# Patient Record
Sex: Female | Born: 1980
Health system: Southern US, Community
[De-identification: ages and names within clinical notes are randomized; demographics above are authoritative.]

## PROBLEM LIST (undated history)

## (undated) DIAGNOSIS — S83104A Unspecified dislocation of right knee, initial encounter: Secondary | ICD-10-CM

## (undated) DIAGNOSIS — K219 Gastro-esophageal reflux disease without esophagitis: Secondary | ICD-10-CM

## (undated) DIAGNOSIS — G5602 Carpal tunnel syndrome, left upper limb: Secondary | ICD-10-CM

## (undated) DIAGNOSIS — G5601 Carpal tunnel syndrome, right upper limb: Secondary | ICD-10-CM

## (undated) DIAGNOSIS — D649 Anemia, unspecified: Secondary | ICD-10-CM

## (undated) HISTORY — PX: TONSILLECTOMY: SUR1361

## (undated) HISTORY — PX: CHOLECYSTECTOMY: SHX55

---
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
## 2000-06-08 ENCOUNTER — Other Ambulatory Visit: Admission: RE | Admit: 2000-06-08 | Discharge: 2000-06-08 | Payer: Self-pay | Admitting: Obstetrics

## 2000-06-20 ENCOUNTER — Emergency Department (HOSPITAL_COMMUNITY): Admission: EM | Admit: 2000-06-20 | Discharge: 2000-06-20 | Payer: Self-pay | Admitting: Emergency Medicine

## 2000-12-18 ENCOUNTER — Inpatient Hospital Stay (HOSPITAL_COMMUNITY): Admission: AD | Admit: 2000-12-18 | Discharge: 2000-12-21 | Payer: Self-pay | Admitting: Obstetrics

## 2001-10-17 ENCOUNTER — Emergency Department (HOSPITAL_COMMUNITY): Admission: EM | Admit: 2001-10-17 | Discharge: 2001-10-18 | Payer: Self-pay | Admitting: Emergency Medicine

## 2002-03-13 ENCOUNTER — Encounter: Payer: Self-pay | Admitting: Emergency Medicine

## 2002-03-13 ENCOUNTER — Emergency Department (HOSPITAL_COMMUNITY): Admission: EM | Admit: 2002-03-13 | Discharge: 2002-03-13 | Payer: Self-pay | Admitting: Emergency Medicine

## 2002-08-05 ENCOUNTER — Emergency Department (HOSPITAL_COMMUNITY): Admission: EM | Admit: 2002-08-05 | Discharge: 2002-08-05 | Payer: Self-pay | Admitting: Emergency Medicine

## 2003-03-20 ENCOUNTER — Inpatient Hospital Stay (HOSPITAL_COMMUNITY): Admission: AD | Admit: 2003-03-20 | Discharge: 2003-03-20 | Payer: Self-pay | Admitting: Obstetrics & Gynecology

## 2003-03-21 ENCOUNTER — Inpatient Hospital Stay (HOSPITAL_COMMUNITY): Admission: AD | Admit: 2003-03-21 | Discharge: 2003-03-21 | Payer: Self-pay | Admitting: Family Medicine

## 2004-01-18 ENCOUNTER — Emergency Department (HOSPITAL_COMMUNITY): Admission: EM | Admit: 2004-01-18 | Discharge: 2004-01-18 | Payer: Self-pay | Admitting: Emergency Medicine

## 2005-12-11 ENCOUNTER — Emergency Department (HOSPITAL_COMMUNITY): Admission: EM | Admit: 2005-12-11 | Discharge: 2005-12-11 | Payer: Self-pay | Admitting: Emergency Medicine

## 2009-03-11 ENCOUNTER — Emergency Department (HOSPITAL_COMMUNITY): Admission: EM | Admit: 2009-03-11 | Discharge: 2009-03-11 | Payer: Self-pay | Admitting: Emergency Medicine

## 2009-03-14 ENCOUNTER — Emergency Department (HOSPITAL_COMMUNITY): Admission: EM | Admit: 2009-03-14 | Discharge: 2009-03-14 | Payer: Self-pay | Admitting: Emergency Medicine

## 2009-04-02 ENCOUNTER — Encounter: Admission: RE | Admit: 2009-04-02 | Discharge: 2009-04-02 | Payer: Self-pay | Admitting: Internal Medicine

## 2010-07-21 LAB — CBC
HCT: 36.3 % (ref 36.0–46.0)
Hemoglobin: 12 g/dL (ref 12.0–15.0)
MCHC: 33.1 g/dL (ref 30.0–36.0)
MCV: 84.5 fL (ref 78.0–100.0)
Platelets: 197 10*3/uL (ref 150–400)
RBC: 4.3 MIL/uL (ref 3.87–5.11)
RDW: 13.1 % (ref 11.5–15.5)
WBC: 6.1 10*3/uL (ref 4.0–10.5)

## 2010-07-21 LAB — COMPREHENSIVE METABOLIC PANEL
ALT: 16 U/L (ref 0–35)
AST: 16 U/L (ref 0–37)
Albumin: 3.7 g/dL (ref 3.5–5.2)
Alkaline Phosphatase: 40 U/L (ref 39–117)
BUN: 10 mg/dL (ref 6–23)
CO2: 24 mEq/L (ref 19–32)
Calcium: 9 mg/dL (ref 8.4–10.5)
Chloride: 105 mEq/L (ref 96–112)
Creatinine, Ser: 0.72 mg/dL (ref 0.4–1.2)
GFR calc Af Amer: >60 mL/min (ref >60)
GFR calc non Af Amer: >60 mL/min (ref >60)
Glucose, Bld: 133 mg/dL — ABNORMAL HIGH (ref 70–99)
Potassium: 3.9 mEq/L (ref 3.5–5.1)
Sodium: 135 mEq/L (ref 135–145)
Total Bilirubin: 0.5 mg/dL (ref 0.3–1.2)
Total Protein: 7.2 g/dL (ref 6.0–8.3)

## 2010-07-21 LAB — POCT CARDIAC MARKERS
Myoglobin, poc: 59 ng/mL (ref 12–200)
Troponin i, poc: 0.13 ng/mL — ABNORMAL HIGH (ref 0.00–0.09)
Troponin i, poc: <0.05 ng/mL (ref 0.00–0.09)

## 2010-07-21 LAB — DIFFERENTIAL
Eosinophils Relative: 2 % (ref 0–5)
Lymphs Abs: 1.6 10*3/uL (ref 0.7–4.0)
Monocytes Absolute: 0.3 10*3/uL (ref 0.1–1.0)
Monocytes Relative: 5 % (ref 3–12)
Neutro Abs: 4 10*3/uL (ref 1.7–7.7)

## 2010-07-21 LAB — CK TOTAL AND CKMB (NOT AT ARMC): Total CK: 249 U/L — ABNORMAL HIGH (ref 7–177)

## 2012-04-23 ENCOUNTER — Encounter (HOSPITAL_COMMUNITY): Payer: Self-pay | Admitting: *Deleted

## 2012-04-23 ENCOUNTER — Emergency Department (HOSPITAL_COMMUNITY)
Admission: EM | Admit: 2012-04-23 | Discharge: 2012-04-23 | Disposition: A | Discharge status: Home / Self Care | Payer: Medicaid Other | Source: Home / Self Care | Attending: Emergency Medicine | Admitting: Emergency Medicine

## 2012-04-23 DIAGNOSIS — J329 Chronic sinusitis, unspecified: Secondary | ICD-10-CM

## 2012-04-23 MED ORDER — AZITHROMYCIN 250 MG PO TABS: ORAL_TABLET | ORAL | Status: DC

## 2012-04-23 MED ORDER — CETIRIZINE-PSEUDOEPHEDRINE ER 5-120 MG PO TB12: 1.0000 | ORAL_TABLET | Freq: Two times a day (BID) | ORAL | Status: DC

## 2012-04-23 MED ORDER — SALINE NASAL SPRAY 0.65 % NA SOLN: 1.0000 | NASAL | Status: DC | PRN

## 2012-04-23 NOTE — ED Notes (Signed)
C/o sinuses are stuffy, sneezing, R ear stopped up.  No sorethroat or fever or cough.  Nasal drainage was clear and now its yellow.  C/o eyes watering and pressure around her eyes.

## 2012-04-23 NOTE — ED Provider Notes (Signed)
History     CSN: [id number]  Arrival date & time 04/23/12  1191   First MD Initiated Contact with Patient 04/23/12 2031      Chief Complaint  Patient presents with  . Sinusitis    (Consider location/radiation/quality/duration/timing/severity/associated sxs/prior treatment) Patient is a 32 y.o. female presenting with sinusitis. The history is provided by the patient.  Sinusitis  This is a new problem. The current episode started more than 1 week ago. The problem has been gradually worsening. There has been no fever. Associated symptoms include congestion, ear pain, sinus pressure and cough. She has tried nothing for the symptoms.    Past Medical History  Diagnosis Date  . Bronchitis   . Carpal tunnel syndrome on both sides   . Shoulder pain     Past Surgical History  Procedure Date  . Cholecystectomy   . Tonsillectomy     History reviewed. No pertinent family history.  History  Substance Use Topics  . Smoking status: Current Every Day Smoker    Types: Cigars  . Smokeless tobacco: Not on file  . Alcohol Use: Yes     Comment: occasional    OB History    Grav Para Term Preterm Abortions TAB SAB Ect Mult Living                  Review of Systems  HENT: Positive for ear pain, congestion, rhinorrhea and sinus pressure.   Respiratory: Positive for cough.   All other systems reviewed and are negative.    Allergies  Review of patient's allergies indicates no known allergies.  Home Medications   Current Outpatient Rx  Name  Route  Sig  Dispense  Refill  . ALBUTEROL SULFATE HFA 108 (90 BASE) MCG/ACT IN AERS   Inhalation   Inhale 2 puffs into the lungs every 6 (six) hours as needed.         . IBUPROFEN 800 MG PO TABS   Oral   Take 800 mg by mouth every 8 (eight) hours as needed.         Azzie Roup ACE-ETH ESTRAD-FE 1-20 MG-MCG(24) PO CHEW   Oral   Chew by mouth.         . OMEPRAZOLE 20 MG PO CPDR   Oral   Take 20 mg by mouth daily.         .  AZITHROMYCIN 250 MG PO TABS      Azithromycin 500mg  on day 1, then 250mg  on days 2-4   6 tablet   0   . CETIRIZINE-PSEUDOEPHEDRINE ER 5-120 MG PO TB12   Oral   Take 1 tablet by mouth 2 (two) times daily.   30 tablet   1   . SALINE NASAL SPRAY 0.65 % NA SOLN   Nasal   Place 1 spray into the nose as needed for congestion.   30 mL   12     BP 139/88  Pulse 86  Temp 99.1 F (37.3 C) (Oral)  Resp 18  SpO2 100%  LMP 04/05/2012  Physical Exam  Nursing note and vitals reviewed. Constitutional: She is oriented to person, place, and time. Vital signs are normal. She appears well-developed and well-nourished. She is active and cooperative.  HENT:  Head: Normocephalic.  Right Ear: Tympanic membrane and external ear normal.  Left Ear: Tympanic membrane and external ear normal.  Nose: Right sinus exhibits frontal sinus tenderness. Right sinus exhibits no maxillary sinus tenderness. Left sinus exhibits frontal sinus tenderness. Left  sinus exhibits no maxillary sinus tenderness.  Mouth/Throat: Uvula is midline, oropharynx is clear and moist and mucous membranes are normal. No oropharyngeal exudate.  Eyes: Conjunctivae normal and EOM are normal. Pupils are equal, round, and reactive to light. No scleral icterus.  Neck: Trachea normal and normal range of motion. Neck supple.  Cardiovascular: Normal rate, regular rhythm and normal heart sounds.   Pulmonary/Chest: Effort normal and breath sounds normal.  Lymphadenopathy:    She has no cervical adenopathy.  Neurological: She is alert and oriented to person, place, and time. No cranial nerve deficit or sensory deficit.  Skin: Skin is warm and dry.  Psychiatric: She has a normal mood and affect. Her speech is normal and behavior is normal. Judgment and thought content normal. Cognition and memory are normal.    ED Course  Procedures (including critical care time)  Labs Reviewed - No data to display No results found.   1. Sinusitis        MDM  Medications as prescribed, follow up with pcp if symptoms are not improved.       Johnsie Kindred, NP 04/23/12 2038

## 2012-04-23 NOTE — ED Provider Notes (Signed)
Medical screening examination/treatment/procedure(s) were performed by non-physician practitioner and as supervising physician I was immediately available for consultation/collaboration.  Leslee Home, M.D.   Reuben Likes, MD 04/23/12 [phone]

## 2012-10-11 ENCOUNTER — Ambulatory Visit (HOSPITAL_COMMUNITY)
Admission: RE | Admit: 2012-10-11 | Discharge: 2012-10-11 | Disposition: A | Discharge status: Home / Self Care | Payer: Medicaid Other | Source: Ambulatory Visit | Attending: Oral Surgery | Admitting: Oral Surgery

## 2012-10-11 ENCOUNTER — Other Ambulatory Visit: Payer: Self-pay | Admitting: Oral Surgery

## 2012-10-11 DIAGNOSIS — Z1389 Encounter for screening for other disorder: Secondary | ICD-10-CM | POA: Insufficient documentation

## 2012-10-11 DIAGNOSIS — Z9089 Acquired absence of other organs: Secondary | ICD-10-CM | POA: Insufficient documentation

## 2012-10-11 DIAGNOSIS — T819XXA Unspecified complication of procedure, initial encounter: Secondary | ICD-10-CM

## 2012-10-11 DIAGNOSIS — K011 Impacted teeth: Secondary | ICD-10-CM

## 2012-10-11 HISTORY — PX: WISDOM TOOTH EXTRACTION: SHX21

## 2012-10-18 ENCOUNTER — Emergency Department (HOSPITAL_COMMUNITY)
Admission: EM | Admit: 2012-10-18 | Discharge: 2012-10-19 | Disposition: A | Discharge status: Home / Self Care | Payer: Medicaid Other | Attending: Emergency Medicine | Admitting: Emergency Medicine

## 2012-10-18 ENCOUNTER — Encounter (HOSPITAL_COMMUNITY): Payer: Self-pay | Admitting: *Deleted

## 2012-10-18 DIAGNOSIS — K089 Disorder of teeth and supporting structures, unspecified: Secondary | ICD-10-CM | POA: Insufficient documentation

## 2012-10-18 DIAGNOSIS — H9209 Otalgia, unspecified ear: Secondary | ICD-10-CM | POA: Insufficient documentation

## 2012-10-18 DIAGNOSIS — F17200 Nicotine dependence, unspecified, uncomplicated: Secondary | ICD-10-CM | POA: Insufficient documentation

## 2012-10-18 DIAGNOSIS — Z8669 Personal history of other diseases of the nervous system and sense organs: Secondary | ICD-10-CM | POA: Insufficient documentation

## 2012-10-18 DIAGNOSIS — K0889 Other specified disorders of teeth and supporting structures: Secondary | ICD-10-CM

## 2012-10-18 DIAGNOSIS — Z79899 Other long term (current) drug therapy: Secondary | ICD-10-CM | POA: Insufficient documentation

## 2012-10-18 DIAGNOSIS — Z8709 Personal history of other diseases of the respiratory system: Secondary | ICD-10-CM | POA: Insufficient documentation

## 2012-10-18 NOTE — ED Notes (Signed)
Pt had oral surgery for wisdom teeth removal a week ago today,  On left side,  Pt has swelling on left side, she is taking antibiotics,  Pt states surgery went bad from beginning,  She was sent immediately after wisdom teeth removal because they thought she had swallowed her tooth,  Pt is in pain 10/10  And feels like needle sticking in left ear.  Pt is alert and oriented but tearful.

## 2012-10-19 MED ORDER — TRAMADOL HCL 50 MG PO TABS: 50.0000 mg | ORAL_TABLET | Freq: Four times a day (QID) | ORAL | Status: DC | PRN

## 2012-10-19 NOTE — ED Provider Notes (Signed)
History    CSN: [id number] Arrival date & time 10/18/12  2108  First MD Initiated Contact with Patient 10/19/12 0025     Chief Complaint  Patient presents with  . Facial Swelling   (Consider location/radiation/quality/duration/timing/severity/associated sxs/prior Treatment) HPI Comments: Patient is a 32 year old female who presents for a stabbing pain in her left ear x5 days. Patient states that she had her left upper and lower wisdom teeth removed by Dr. Barbette Merino one week ago. Patient admits to developing infection post wisdom tooth extraction. Dr. Barbette Merino put patient on Augmentin 5 days ago for symptoms. Patient states that infection has resolved, but stabbing pain has persisted. Patient states the pain is constant, alleviated with goody powders and eased with heat packs. Patient states the pain is 10 out of 10 in severity. She denies fevers, eye pain or redness, ear discharge, drooling, difficulty speaking or swallowing, and shortness of breath.  The history is provided by the patient. No language interpreter was used.   Past Medical History  Diagnosis Date  . Bronchitis   . Carpal tunnel syndrome on both sides   . Shoulder pain    Past Surgical History  Procedure Laterality Date  . Cholecystectomy    . Tonsillectomy    . Wisdom tooth extraction      removal on June 26th 2014   History reviewed. No pertinent family history. History  Substance Use Topics  . Smoking status: Current Every Day Smoker    Types: Cigars  . Smokeless tobacco: Not on file  . Alcohol Use: Yes     Comment: occasional   OB History   Grav Para Term Preterm Abortions TAB SAB Ect Mult Living                 Review of Systems  Constitutional: Negative for fever.  HENT: Positive for ear pain, facial swelling (mild; L side) and dental problem (pain). Negative for hearing loss, sore throat, drooling, trouble swallowing, neck pain, neck stiffness and ear discharge.   Respiratory: Negative for shortness of  breath.   Neurological: Negative for weakness and numbness.  All other systems reviewed and are negative.    Allergies  Review of patient's allergies indicates no known allergies.  Home Medications   Current Outpatient Rx  Name  Route  Sig  Dispense  Refill  . amoxicillin-clavulanate (AUGMENTIN) 875-125 MG per tablet   Oral   Take 1 tablet by mouth 2 (two) times daily. For 7 days only. Star date 10/15/12         . ibuprofen (ADVIL,MOTRIN) 800 MG tablet   Oral   Take 800 mg by mouth every 8 (eight) hours as needed for pain.          . Norethin Ace-Eth Estrad-FE (LOESTRIN FE 1/20 PO)   Oral   Take 1 tablet by mouth daily.         Marland Kitchen omeprazole (PRILOSEC) 20 MG capsule   Oral   Take 20 mg by mouth daily.         Marland Kitchen oxyCODONE-acetaminophen (PERCOCET/ROXICET) 5-325 MG per tablet   Oral   Take 1 tablet by mouth every 4 (four) hours as needed for pain.         Marland Kitchen albuterol (PROVENTIL HFA;VENTOLIN HFA) 108 (90 BASE) MCG/ACT inhaler   Inhalation   Inhale 2 puffs into the lungs every 6 (six) hours as needed for shortness of breath.          . traMADol (ULTRAM) 50 MG  tablet   Oral   Take 1 tablet (50 mg total) by mouth every 6 (six) hours as needed for pain.   25 tablet   0    BP 133/79  Pulse 85  Temp(Src) 99.8 F (37.7 C) (Oral)  Resp 20  SpO2 100%  LMP 09/20/2012  Physical Exam  Nursing note and vitals reviewed. Constitutional: She is oriented to person, place, and time. She appears well-developed and well-nourished. No distress.  HENT:  Head: Normocephalic and atraumatic. No trismus in the jaw.  Right Ear: Tympanic membrane, external ear and ear canal normal. No tenderness. No mastoid tenderness.  Left Ear: Tympanic membrane, external ear and ear canal normal. No tenderness. No mastoid tenderness.  Nose: Nose normal.  Mouth/Throat: Uvula is midline, oropharynx is clear and moist and mucous membranes are normal. No oral lesions. Abnormal dentition (various  teeth missing). No dental abscesses. No oropharyngeal exudate, posterior oropharyngeal edema, posterior oropharyngeal erythema or tonsillar abscesses.  Eyes: Conjunctivae and EOM are normal. Pupils are equal, round, and reactive to light. No scleral icterus.  Neck: Normal range of motion. Neck supple.  Cardiovascular: Normal rate, regular rhythm and normal heart sounds.   Pulmonary/Chest: Effort normal and breath sounds normal. No stridor. No respiratory distress. She has no wheezes. She has no rales.  Musculoskeletal: Normal range of motion.  Lymphadenopathy:    She has no cervical adenopathy.  Neurological: She is alert and oriented to person, place, and time.  Skin: Skin is warm and dry. No rash noted. She is not diaphoretic. No erythema. No pallor.  Psychiatric: She has a normal mood and affect. Her behavior is normal.    ED Course  Procedures (including critical care time) Labs Reviewed - No data to display No results found.  1. Pain, dental     MDM  Referred dental pain without complicating factors - pain secondary to wisdom tooth extraction one week ago. On exam there is no trismus or stridor. Uvula midline without evidence of peritonsillar abscess. No area of fluctuance to suspect drainable dental abscess. Gingiva pink and healthy appearing without erythema or swelling. No signs concerning for Ludwig's angina. Patient reliable for oral surgery and dentistry followup for further evaluation of symptoms. Patient does not want Percocet as she says it makes her feel too "drugged up". Rx for tramadol provided for pain and advised patient to continue heat packs. Indications for ED return discussed with the patient who verbalizes comfort and understanding with this discharge plan.  Antony Madura, PA-C 10/19/12 0110

## 2012-10-19 NOTE — ED Provider Notes (Signed)
Medical screening examination/treatment/procedure(s) were performed by non-physician practitioner and as supervising physician I was immediately available for consultation/collaboration.   Joya Gaskins, MD 10/19/12 [phone]

## 2014-12-15 LAB — PROCEDURE REPORT - SCANNED: Pap: NEGATIVE

## 2016-01-13 ENCOUNTER — Encounter: Payer: Self-pay | Admitting: *Deleted

## 2016-07-26 ENCOUNTER — Other Ambulatory Visit: Payer: Self-pay | Admitting: Orthopedic Surgery

## 2016-08-04 ENCOUNTER — Encounter (HOSPITAL_BASED_OUTPATIENT_CLINIC_OR_DEPARTMENT_OTHER): Payer: Self-pay | Admitting: *Deleted

## 2016-08-11 ENCOUNTER — Encounter (HOSPITAL_BASED_OUTPATIENT_CLINIC_OR_DEPARTMENT_OTHER)
Admission: RE | Disposition: A | Discharge status: Home / Self Care | Payer: Self-pay | Source: Ambulatory Visit | Attending: Orthopedic Surgery

## 2016-08-11 ENCOUNTER — Ambulatory Visit (HOSPITAL_BASED_OUTPATIENT_CLINIC_OR_DEPARTMENT_OTHER)
Admission: RE | Admit: 2016-08-11 | Discharge: 2016-08-11 | Disposition: A | Discharge status: Home / Self Care | Payer: 59 | Source: Ambulatory Visit | Attending: Orthopedic Surgery | Admitting: Orthopedic Surgery

## 2016-08-11 ENCOUNTER — Ambulatory Visit (HOSPITAL_BASED_OUTPATIENT_CLINIC_OR_DEPARTMENT_OTHER): Payer: 59 | Admitting: Anesthesiology

## 2016-08-11 ENCOUNTER — Encounter (HOSPITAL_BASED_OUTPATIENT_CLINIC_OR_DEPARTMENT_OTHER): Payer: Self-pay | Admitting: Anesthesiology

## 2016-08-11 DIAGNOSIS — F17290 Nicotine dependence, other tobacco product, uncomplicated: Secondary | ICD-10-CM | POA: Insufficient documentation

## 2016-08-11 DIAGNOSIS — Z9049 Acquired absence of other specified parts of digestive tract: Secondary | ICD-10-CM | POA: Insufficient documentation

## 2016-08-11 DIAGNOSIS — Z79899 Other long term (current) drug therapy: Secondary | ICD-10-CM | POA: Diagnosis not present

## 2016-08-11 DIAGNOSIS — G5601 Carpal tunnel syndrome, right upper limb: Secondary | ICD-10-CM | POA: Diagnosis not present

## 2016-08-11 DIAGNOSIS — Z6835 Body mass index (BMI) 35.0-35.9, adult: Secondary | ICD-10-CM | POA: Diagnosis not present

## 2016-08-11 HISTORY — PX: CARPAL TUNNEL RELEASE: SHX101

## 2016-08-11 HISTORY — DX: Carpal tunnel syndrome, right upper limb: G56.01

## 2016-08-11 SURGERY — CARPAL TUNNEL RELEASE
Anesthesia: Monitor Anesthesia Care | Site: Hand | Laterality: Right

## 2016-08-11 MED ORDER — FENTANYL CITRATE (PF) 100 MCG/2ML IJ SOLN
INTRAMUSCULAR | Status: DC | PRN
  Administered 2016-08-11 (×2): 50 ug via INTRAVENOUS

## 2016-08-11 MED ORDER — HYDROMORPHONE HCL 1 MG/ML IJ SOLN: 0.2500 mg | INTRAMUSCULAR | Status: DC | PRN

## 2016-08-11 MED ORDER — FENTANYL CITRATE (PF) 100 MCG/2ML IJ SOLN
INTRAMUSCULAR | Status: AC
  Filled 2016-08-11: qty 2

## 2016-08-11 MED ORDER — MIDAZOLAM HCL 2 MG/2ML IJ SOLN
INTRAMUSCULAR | Status: AC
  Filled 2016-08-11: qty 2

## 2016-08-11 MED ORDER — 0.9 % SODIUM CHLORIDE (POUR BTL) OPTIME
TOPICAL | Status: DC | PRN
  Administered 2016-08-11: 1000 mL

## 2016-08-11 MED ORDER — LACTATED RINGERS IV SOLN
INTRAVENOUS | Status: DC
  Administered 2016-08-11 (×2): via INTRAVENOUS

## 2016-08-11 MED ORDER — VANCOMYCIN HCL IN DEXTROSE 1-5 GM/200ML-% IV SOLN: 1000.0000 mg | INTRAVENOUS | Status: DC

## 2016-08-11 MED ORDER — PROPOFOL 10 MG/ML IV BOLUS
INTRAVENOUS | Status: DC | PRN
  Administered 2016-08-11 (×3): 20 mg via INTRAVENOUS

## 2016-08-11 MED ORDER — LIDOCAINE 2% (20 MG/ML) 5 ML SYRINGE
INTRAMUSCULAR | Status: AC
  Filled 2016-08-11: qty 5

## 2016-08-11 MED ORDER — PROPOFOL 10 MG/ML IV BOLUS
INTRAVENOUS | Status: AC
  Filled 2016-08-11: qty 20

## 2016-08-11 MED ORDER — BUPIVACAINE HCL (PF) 0.25 % IJ SOLN
INTRAMUSCULAR | Status: DC | PRN
  Administered 2016-08-11: 9 mL

## 2016-08-11 MED ORDER — FENTANYL CITRATE (PF) 100 MCG/2ML IJ SOLN: 50.0000 ug | INTRAMUSCULAR | Status: DC | PRN

## 2016-08-11 MED ORDER — ONDANSETRON HCL 4 MG/2ML IJ SOLN
INTRAMUSCULAR | Status: AC
  Filled 2016-08-11: qty 2

## 2016-08-11 MED ORDER — PROMETHAZINE HCL 25 MG/ML IJ SOLN: 6.2500 mg | INTRAMUSCULAR | Status: DC | PRN

## 2016-08-11 MED ORDER — LIDOCAINE HCL (PF) 0.5 % IJ SOLN
INTRAMUSCULAR | Status: DC | PRN
  Administered 2016-08-11: 30 mL via INTRAVENOUS

## 2016-08-11 MED ORDER — SCOPOLAMINE 1 MG/3DAYS TD PT72: 1.0000 | MEDICATED_PATCH | Freq: Once | TRANSDERMAL | Status: DC | PRN

## 2016-08-11 MED ORDER — FENTANYL CITRATE (PF) 100 MCG/2ML IJ SOLN
INTRAMUSCULAR | Status: AC
Start: 2016-08-11 — End: 2016-08-11
  Filled 2016-08-11: qty 2

## 2016-08-11 MED ORDER — HYDROCODONE-ACETAMINOPHEN 5-325 MG PO TABS
1.0000 | ORAL_TABLET | Freq: Once | ORAL | Status: AC
  Administered 2016-08-11: 1 via ORAL

## 2016-08-11 MED ORDER — HYDROCODONE-ACETAMINOPHEN 5-325 MG PO TABS: ORAL_TABLET | ORAL | 0 refills | Status: DC

## 2016-08-11 MED ORDER — VANCOMYCIN HCL IN DEXTROSE 1-5 GM/200ML-% IV SOLN
INTRAVENOUS | Status: AC
  Filled 2016-08-11: qty 200

## 2016-08-11 MED ORDER — CEFAZOLIN SODIUM-DEXTROSE 2-4 GM/100ML-% IV SOLN
2.0000 g | INTRAVENOUS | Status: AC
  Administered 2016-08-11: 2 g via INTRAVENOUS

## 2016-08-11 MED ORDER — MIDAZOLAM HCL 5 MG/5ML IJ SOLN
INTRAMUSCULAR | Status: DC | PRN
  Administered 2016-08-11: 2 mg via INTRAVENOUS

## 2016-08-11 MED ORDER — DEXAMETHASONE SODIUM PHOSPHATE 10 MG/ML IJ SOLN
INTRAMUSCULAR | Status: AC
  Filled 2016-08-11: qty 1

## 2016-08-11 MED ORDER — MIDAZOLAM HCL 2 MG/2ML IJ SOLN: 1.0000 mg | INTRAMUSCULAR | Status: DC | PRN

## 2016-08-11 MED ORDER — CEFAZOLIN SODIUM-DEXTROSE 2-4 GM/100ML-% IV SOLN
INTRAVENOUS | Status: AC
  Filled 2016-08-11: qty 100

## 2016-08-11 MED ORDER — HYDROCODONE-ACETAMINOPHEN 5-325 MG PO TABS
ORAL_TABLET | ORAL | Status: AC
  Filled 2016-08-11: qty 1

## 2016-08-11 SURGICAL SUPPLY — 34 items
BANDAGE ACE 3X5.8 VEL STRL LF (GAUZE/BANDAGES/DRESSINGS) ×2 IMPLANT
BLADE SURG 15 STRL LF DISP TIS (BLADE) ×2 IMPLANT
BLADE SURG 15 STRL SS (BLADE) ×4
BNDG CMPR 9X4 STRL LF SNTH (GAUZE/BANDAGES/DRESSINGS) ×1
BNDG ESMARK 4X9 LF (GAUZE/BANDAGES/DRESSINGS) ×1 IMPLANT
BNDG GAUZE ELAST 4 BULKY (GAUZE/BANDAGES/DRESSINGS) ×2 IMPLANT
CHLORAPREP W/TINT 26ML (MISCELLANEOUS) ×2 IMPLANT
CORDS BIPOLAR (ELECTRODE) ×2 IMPLANT
COVER BACK TABLE 60X90IN (DRAPES) ×2 IMPLANT
COVER MAYO STAND STRL (DRAPES) ×2 IMPLANT
CUFF TOURNIQUET SINGLE 18IN (TOURNIQUET CUFF) ×2 IMPLANT
DRAPE EXTREMITY T 121X128X90 (DRAPE) ×2 IMPLANT
DRAPE SURG 17X23 STRL (DRAPES) ×2 IMPLANT
DRSG PAD ABDOMINAL 8X10 ST (GAUZE/BANDAGES/DRESSINGS) ×2 IMPLANT
GAUZE SPONGE 4X4 12PLY STRL (GAUZE/BANDAGES/DRESSINGS) ×2 IMPLANT
GAUZE XEROFORM 1X8 LF (GAUZE/BANDAGES/DRESSINGS) ×2 IMPLANT
GLOVE BIO SURGEON STRL SZ7.5 (GLOVE) ×2 IMPLANT
GLOVE BIOGEL PI IND STRL 8 (GLOVE) ×1 IMPLANT
GLOVE BIOGEL PI INDICATOR 8 (GLOVE) ×1
GOWN STRL REUS W/ TWL LRG LVL3 (GOWN DISPOSABLE) ×1 IMPLANT
GOWN STRL REUS W/TWL LRG LVL3 (GOWN DISPOSABLE) ×4
GOWN STRL REUS W/TWL XL LVL3 (GOWN DISPOSABLE) ×2 IMPLANT
NDL HYPO 25X1 1.5 SAFETY (NEEDLE) ×1 IMPLANT
NEEDLE HYPO 25X1 1.5 SAFETY (NEEDLE) ×2 IMPLANT
NS IRRIG 1000ML POUR BTL (IV SOLUTION) ×2 IMPLANT
PACK BASIN DAY SURGERY FS (CUSTOM PROCEDURE TRAY) ×2 IMPLANT
PADDING CAST ABS 4INX4YD NS (CAST SUPPLIES) ×1
PADDING CAST ABS COTTON 4X4 ST (CAST SUPPLIES) ×1 IMPLANT
STOCKINETTE 4X48 STRL (DRAPES) ×2 IMPLANT
SUT ETHILON 4 0 PS 2 18 (SUTURE) ×2 IMPLANT
SYR BULB 3OZ (MISCELLANEOUS) ×2 IMPLANT
SYR CONTROL 10ML LL (SYRINGE) ×2 IMPLANT
TOWEL OR 17X24 6PK STRL BLUE (TOWEL DISPOSABLE) ×4 IMPLANT
UNDERPAD 30X30 (UNDERPADS AND DIAPERS) ×2 IMPLANT

## 2016-08-11 NOTE — Op Note (Signed)
08/11/2016 [facility]                              OPERATIVE REPORT   PREOPERATIVE DIAGNOSIS:  Right carpal tunnel syndrome.  POSTOPERATIVE DIAGNOSIS:  Right carpal tunnel syndrome.  PROCEDURE:  Right carpal tunnel release.  SURGEON:  Betha Loa, MD  ASSISTANT:  none.  ANESTHESIA:  Bier block and sedation.  IV FLUIDS:  Per anesthesia flow sheet.  ESTIMATED BLOOD LOSS:  Minimal.  COMPLICATIONS:  None.  SPECIMENS:  None.  TOURNIQUET TIME:    Total Tourniquet Time Documented: Forearm (Right) - 26 minutes Total: Forearm (Right) - 26 minutes   DISPOSITION:  Stable to PACU.  LOCATION: [facility]  INDICATIONS:  36 yo female with tingling in digits.  Positive nerve conduction studies.   She wishes to have a carpal tunnel release for management of her symptoms.  Risks, benefits and alternatives of surgery were discussed including the risk of blood loss; infection; damage to nerves, vessels, tendons, ligaments, bone; failure of surgery; need for additional surgery; complications with wound healing; continued pain; recurrence of carpal tunnel syndrome; and damage to motor branch. She voiced understanding of these risks and elected to proceed.   OPERATIVE COURSE:  After being identified preoperatively by myself, the patient and I agreed upon the procedure and site of procedure.  The surgical site was marked.  The risks, benefits, and alternatives of the surgery were reviewed and she wished to proceed.  Surgical consent had been signed.  She was given IV Ancef as preoperative antibiotic prophylaxis.  She was transferred to the operating room and placed on the operating room table in supine position with the Right upper extremity on an armboard.  Bier block and sedation was induced by Anesthesiology.  Right upper extremity was prepped and draped in normal sterile orthopaedic fashion.  A surgical pause was performed between the surgeons, anesthesia, and operating  room staff, and all were in agreement as to the patient, procedure, and site of procedure.  Tourniquet at the proximal aspect of the forearm  had been inflated for the Bier block.  Incision was made over the transverse carpal ligament and carried into the subcutaneous tissues by spreading technique.  Bipolar electrocautery was used to obtain hemostasis.  The palmar fascia was sharply incised.  The transverse carpal ligament was identified and sharply incised.  It was incised distally first.  Care was taken to ensure complete decompression distally.  It was then incised proximally.  Scissors were used to split the distal aspect of the volar antebrachial fascia.  A finger was placed into the wound to ensure complete decompression, which was the case.  The nerve was examined.  It was flattened and hyperemic.  The motor branch was identified and was intact.  The wound was copiously irrigated with sterile saline.  It was then closed with 4-0 nylon in a horizontal mattress fashion.  It was injected with 0.25% plain Marcaine to aid in postoperative analgesia.  It was dressed with sterile Xeroform, 4x4s, an ABD, and wrapped with Kerlix and an Ace bandage.  Tourniquet was deflated at 26 minutes.  Fingertips were pink with brisk capillary refill after deflation of the tourniquet.  Operative drapes were broken down.  The patient was awoken from anesthesia safely.  She was transferred back to stretcher and taken to the PACU in stable condition.  I will see her back in the office in  1 week for postoperative followup.  I will give her a prescription for norco 5/325 1-2 tabs PO q6 hours prn pain, dispense #20.    Tami Ribas, MD Electronically signed, 08/11/16

## 2016-08-11 NOTE — Transfer of Care (Signed)
Immediate Anesthesia Transfer of Care Note  Patient: Zoe Hernandez  Procedure(s) Performed: Procedure(s): RIGHT CARPAL TUNNEL RELEASE (Right)  Patient Location: PACU  Anesthesia Type:Bier block  Level of Consciousness: awake and patient cooperative  Airway & Oxygen Therapy: Patient Spontanous Breathing and Patient connected to face mask oxygen  Post-op Assessment: Report given to RN and Post -op Vital signs reviewed and stable  Post vital signs: Reviewed and stable  Last Vitals:  Vitals:   08/11/16 0819 08/11/16 1222  BP: 131/89   Pulse: 70 83  Resp: 18 16  Temp: 36.9 C     Last Pain:  Vitals:   08/11/16 0819  TempSrc: Oral  PainSc: 7       Patients Stated Pain Goal: 4 (08/11/16 0819)  Complications: No apparent anesthesia complications

## 2016-08-11 NOTE — Anesthesia Preprocedure Evaluation (Signed)
Anesthesia Evaluation  Patient identified by MRN, date of birth, ID band Patient awake    Reviewed: Allergy & Precautions, NPO status , Patient's Chart, lab work & pertinent test results  History of Anesthesia Complications Negative for: history of anesthetic complications  Airway Mallampati: II  TM Distance: >3 FB Neck ROM: Full    Dental no notable dental hx. (+) Dental Advisory Given   Pulmonary Current Smoker,    Pulmonary exam normal        Cardiovascular negative cardio ROS Normal cardiovascular exam     Neuro/Psych negative neurological ROS  negative psych ROS   GI/Hepatic negative GI ROS, Neg liver ROS,   Endo/Other  Morbid obesity  Renal/GU negative Renal ROS  negative genitourinary   Musculoskeletal negative musculoskeletal ROS (+)   Abdominal   Peds negative pediatric ROS (+)  Hematology negative hematology ROS (+)   Anesthesia Other Findings   Reproductive/Obstetrics negative OB ROS                             Anesthesia Physical Anesthesia Plan  ASA: II  Anesthesia Plan: MAC and Bier Block   Post-op Pain Management:    Induction:   Airway Management Planned: Natural Airway  Additional Equipment:   Intra-op Plan:   Post-operative Plan:   Informed Consent: I have reviewed the patients History and Physical, chart, labs and discussed the procedure including the risks, benefits and alternatives for the proposed anesthesia with the patient or authorized representative who has indicated his/her understanding and acceptance.   Dental advisory given  Plan Discussed with: CRNA, Anesthesiologist and Surgeon  Anesthesia Plan Comments:         Anesthesia Quick Evaluation

## 2016-08-11 NOTE — Addendum Note (Signed)
Addendum  created 08/11/16 1438 by Heather Roberts, MD   Order list changed, Order sets accessed

## 2016-08-11 NOTE — Anesthesia Procedure Notes (Signed)
Anesthesia Regional Block: Bier block (IV Regional)   Pre-Anesthetic Checklist: ,, timeout performed, Correct Patient, Correct Site, Correct Laterality, Correct Procedure, Correct Position, site marked, Risks and benefits discussed,  Surgical consent,  Pre-op evaluation,  At surgeon's request and post-op pain management  Laterality: Right  Prep: chloraprep        Narrative:  Start time: 08/11/2016 11:45 AM End time: 08/11/2016 11:48 AM Injection made incrementally with aspirations every 30 mL.

## 2016-08-11 NOTE — Discharge Instructions (Addendum)

## 2016-08-11 NOTE — Anesthesia Postprocedure Evaluation (Addendum)
Anesthesia Post Note  Patient: Zoe Hernandez  Procedure(s) Performed: Procedure(s) (LRB): RIGHT CARPAL TUNNEL RELEASE (Right)  Patient location during evaluation: PACU Anesthesia Type: MAC Level of consciousness: awake and alert Pain management: pain level controlled Vital Signs Assessment: post-procedure vital signs reviewed and stable Respiratory status: spontaneous breathing and respiratory function stable Cardiovascular status: stable Anesthetic complications: no       Last Vitals:  Vitals:   08/11/16 1245 08/11/16 1300  BP: 121/86 123/85  Pulse: 76 78  Resp: 20 18  Temp:      Last Pain:  Vitals:   08/11/16 1300  TempSrc:   PainSc: 0-No pain                 Sherilyn Windhorst DANIEL

## 2016-08-11 NOTE — H&P (Signed)
  Zoe Hernandez is an 36 y.o. female.   Chief Complaint: right carpal tunnel syndrome HPI: 36 yo female with numbness and tingling in digits.  Nocturnal symptoms wake her.  Positive nerve conduction studies.  Allergies: No Known Allergies  Past Medical History:  Diagnosis Date  . Carpal tunnel syndrome of right wrist 07/2016    Past Surgical History:  Procedure Laterality Date  . CHOLECYSTECTOMY    . TONSILLECTOMY    . WISDOM TOOTH EXTRACTION  10/11/2012    Family History: History reviewed. No pertinent family history.  Social History:   reports that she has been smoking Cigars.  She has smoked for the past 15.00 years. She has never used smokeless tobacco. She reports that she drinks alcohol. She reports that she does not use drugs.  Medications: Medications Prior to Admission  Medication Sig Dispense Refill  . Norethin Ace-Eth Estrad-FE (LOESTRIN FE 1/20 PO) Take 1 tablet by mouth daily.    . traMADol (ULTRAM) 50 MG tablet Take 1 tablet (50 mg total) by mouth every 6 (six) hours as needed for pain. 25 tablet 0    No results found for this or any previous visit (from the past 48 hour(s)).  No results found.   A comprehensive review of systems was negative.  Height  (1.651 m), weight 93 kg (205 lb), last menstrual period 07/21/2016.  General appearance: alert, cooperative and appears stated age Head: Normocephalic, without obvious abnormality, atraumatic Neck: supple, symmetrical, trachea midline Resp: clear to auscultation bilaterally Cardio: regular rate and rhythm GI: non-tender Extremities: Intact sensation and capillary refill all digits.  +epl/fpl/io.  No wounds.  Pulses: 2+ and symmetric Skin: Skin color, texture, turgor normal. No rashes or lesions Neurologic: Grossly normal Incision/Wound:none  Assessment/Plan Right carpal tunnel syndrome.  Non operative and operative treatment options were discussed with the patient and patient wishes to proceed  with operative treatment. Risks, benefits, and alternatives of surgery were discussed and the patient agrees with the plan of care.   Patton Rabinovich R 08/11/2016, 8:32 AM

## 2016-08-11 NOTE — Brief Op Note (Signed)
08/11/2016  12:15 PM  PATIENT:  Zoe Hernandez  36 y.o. female  PRE-OPERATIVE DIAGNOSIS:  RIGHT CARPAL TUNNEL SYNDROME  POST-OPERATIVE DIAGNOSIS:  RIGHT CARPAL TUNNEL SYNDROME  PROCEDURE:  Procedure(s): RIGHT CARPAL TUNNEL RELEASE (Right)  SURGEON:  Surgeon(s) and Role:    * Betha Loa, MD - Primary  PHYSICIAN ASSISTANT:   ASSISTANTS: none   ANESTHESIA:   Bier block with sedation  EBL:  No intake/output data recorded.  BLOOD ADMINISTERED:none  DRAINS: none   LOCAL MEDICATIONS USED:  MARCAINE     SPECIMEN:  No Specimen  DISPOSITION OF SPECIMEN:  N/A  COUNTS:  YES  TOURNIQUET:   Total Tourniquet Time Documented: Forearm (Right) - 26 minutes Total: Forearm (Right) - 26 minutes   DICTATION: .Note written in EPIC  PLAN OF CARE: Discharge to home after PACU  PATIENT DISPOSITION:  PACU - hemodynamically stable.

## 2016-08-12 ENCOUNTER — Encounter (HOSPITAL_BASED_OUTPATIENT_CLINIC_OR_DEPARTMENT_OTHER): Payer: Self-pay | Admitting: Orthopedic Surgery

## 2016-09-17 ENCOUNTER — Encounter (HOSPITAL_BASED_OUTPATIENT_CLINIC_OR_DEPARTMENT_OTHER): Payer: Self-pay | Admitting: Orthopedic Surgery

## 2016-09-17 NOTE — Addendum Note (Signed)
Addendum  created 09/17/16 0854 by Isaiah Torok, MD   Sign clinical note    

## 2017-11-15 ENCOUNTER — Encounter: Payer: Self-pay | Admitting: Obstetrics

## 2017-11-15 ENCOUNTER — Ambulatory Visit (INDEPENDENT_AMBULATORY_CARE_PROVIDER_SITE_OTHER): Payer: BLUE CROSS/BLUE SHIELD | Admitting: Obstetrics

## 2017-11-15 VITALS — BP 133/82 | HR 89 | Ht 65.0 in | Wt 208.8 lb

## 2017-11-15 DIAGNOSIS — E669 Obesity, unspecified: Secondary | ICD-10-CM

## 2017-11-15 DIAGNOSIS — N760 Acute vaginitis: Secondary | ICD-10-CM | POA: Diagnosis not present

## 2017-11-15 DIAGNOSIS — F17200 Nicotine dependence, unspecified, uncomplicated: Secondary | ICD-10-CM

## 2017-11-15 DIAGNOSIS — N898 Other specified noninflammatory disorders of vagina: Secondary | ICD-10-CM | POA: Diagnosis not present

## 2017-11-15 DIAGNOSIS — Z1151 Encounter for screening for human papillomavirus (HPV): Secondary | ICD-10-CM | POA: Diagnosis not present

## 2017-11-15 DIAGNOSIS — Z113 Encounter for screening for infections with a predominantly sexual mode of transmission: Secondary | ICD-10-CM

## 2017-11-15 DIAGNOSIS — Z124 Encounter for screening for malignant neoplasm of cervix: Secondary | ICD-10-CM | POA: Diagnosis not present

## 2017-11-15 DIAGNOSIS — Z01419 Encounter for gynecological examination (general) (routine) without abnormal findings: Secondary | ICD-10-CM

## 2017-11-15 DIAGNOSIS — Z3009 Encounter for other general counseling and advice on contraception: Secondary | ICD-10-CM

## 2017-11-15 NOTE — Progress Notes (Signed)
Pt presents for AEX. Pt has no concerns.

## 2017-11-15 NOTE — Progress Notes (Signed)
Subjective:        Zoe Hernandez is a 37 y.o. female here for a routine exam.  Current complaints: None.    Personal health questionnaire:  Is patient [name], have a family history of breast and/or ovarian cancer: no Is there a family history of uterine cancer diagnosed at age < 1850, gastrointestinal cancer, urinary tract cancer, family member who is a Personnel officerLynch syndrome-associated carrier: no Is the patient overweight and hypertensive, family history of diabetes, personal history of gestational diabetes, preeclampsia or PCOS: no Is patient over 4355, have PCOS,  family history of premature CHD under age 37, diabetes, smoke, have hypertension or peripheral artery disease:  no At any time, has a partner hit, kicked or otherwise hurt or frightened you?: no Over the past 2 weeks, have you felt down, depressed or hopeless?: no Over the past 2 weeks, have you felt little interest or pleasure in doing things?:no   Gynecologic History Patient's last menstrual period was 10/16/2017. Contraception: vasectomy Last Pap: 2017. Results were: normal Last mammogram: n/a. Results were: n/a  Obstetric History OB History  Gravida Para Term Preterm AB Living  3 1 1   2 1   SAB TAB Ectopic Multiple Live Births    1     1    # Outcome Date GA Lbr Len/2nd Weight Sex Delivery Anes PTL Lv  3 Term 12/19/00     Vag-Spont   LIV  2 AB           1 TAB             Past Medical History:  Diagnosis Date  . Carpal tunnel syndrome of right wrist 07/2016    Past Surgical History:  Procedure Laterality Date  . CARPAL TUNNEL RELEASE Right 08/11/2016   Procedure: RIGHT CARPAL TUNNEL RELEASE;  Surgeon: Betha LoaKuzma, Kevin, MD;  Location: [facility];  Service: Orthopedics;  Laterality: Right;  . CHOLECYSTECTOMY    . TONSILLECTOMY    . WISDOM TOOTH EXTRACTION  10/11/2012     Current Outpatient Medications:  .  albuterol (PROVENTIL HFA;VENTOLIN HFA) 108 (90 Base) MCG/ACT inhaler, Inhale into  the lungs., Disp: , Rfl:  .  ibuprofen (ADVIL,MOTRIN) 800 MG tablet, TK 1 TABLET PO TID PRN, Disp: , Rfl: 3 .  phentermine 37.5 MG capsule, Take by mouth., Disp: , Rfl:  No Known Allergies  Social History   Tobacco Use  . Smoking status: Current Every Day Smoker    Years: 15.00    Types: Cigars  . Smokeless tobacco: Never Used  . Tobacco comment: 1 cigar/day  Substance Use Topics  . Alcohol use: Yes    Comment: socially    Family History  Problem Relation Age of Onset  . Hypertension Mother   . Heart disease Mother   . Lung cancer Maternal Grandmother       Review of Systems  Constitutional: negative for fatigue and weight loss Respiratory: negative for cough and wheezing Cardiovascular: negative for chest pain, fatigue and palpitations Gastrointestinal: negative for abdominal pain and change in bowel habits Musculoskeletal:negative for myalgias Neurological: negative for gait problems and tremors Behavioral/Psych: negative for abusive relationship, depression Endocrine: negative for temperature intolerance    Genitourinary:negative for abnormal menstrual periods, genital lesions, hot flashes, sexual problems and vaginal discharge Integument/breast: negative for breast lump, breast tenderness, nipple discharge and skin lesion(s)    Objective:       BP 133/82   Pulse 89   Ht 5\' 5"  (  1.651 m)   Wt 208 lb 12.8 oz (94.7 kg)   LMP 10/16/2017   BMI 34.75 kg/m  General:   alert  Skin:   no rash or abnormalities  Lungs:   clear to auscultation bilaterally  Heart:   regular rate and rhythm, S1, S2 normal, no murmur, click, rub or gallop  Breasts:   normal without suspicious masses, skin or nipple changes or axillary nodes  Abdomen:  normal findings: no organomegaly, soft, non-tender and no hernia  Pelvis:  External genitalia: normal general appearance Urinary system: urethral meatus normal and bladder without fullness, nontender Vaginal: normal without tenderness,  induration or masses Cervix: normal appearance Adnexa: normal bimanual exam Uterus: anteverted and non-tender, normal size   Lab Review Urine pregnancy test Labs reviewed yes Radiologic studies reviewed no  50% of 20 min visit spent on counseling and coordination of care.   Assessment:     1. Encounter for routine gynecological examination with Papanicolaou smear of cervix Rx: - Cytology - PAP  2. Vaginal discharge Rx: - Cervicovaginal ancillary only  3. Encounter for other general counseling and advice on contraception - husband has a Vasectomy  4. Tobacco dependence - smoking cessation encouraged  5. Obesity (BMI 35.0-39.9 without comorbidity) - program of caloric reduction, exercise and behavioral modification recommended    Plan:    Education reviewed: calcium supplements, depression evaluation, low fat, low cholesterol diet, self breast exams, smoking cessation and weight bearing exercise. Follow up in: 1 year.   No orders of the defined types were placed in this encounter.  No orders of the defined types were placed in this encounter.   Brock Bad MD 11-15-2017

## 2017-11-16 LAB — CYTOLOGY - PAP
Diagnosis: NEGATIVE
HPV: NOT DETECTED

## 2017-11-17 ENCOUNTER — Other Ambulatory Visit: Payer: Self-pay | Admitting: Obstetrics

## 2017-11-17 DIAGNOSIS — B9689 Other specified bacterial agents as the cause of diseases classified elsewhere: Secondary | ICD-10-CM

## 2017-11-17 DIAGNOSIS — N760 Acute vaginitis: Secondary | ICD-10-CM

## 2017-11-17 LAB — CERVICOVAGINAL ANCILLARY ONLY
Bacterial vaginitis: POSITIVE — AB
Candida vaginitis: NEGATIVE
Chlamydia: NEGATIVE
Neisseria Gonorrhea: NEGATIVE
Trichomonas: NEGATIVE

## 2017-11-17 MED ORDER — TINIDAZOLE 500 MG PO TABS: 1000.0000 mg | ORAL_TABLET | Freq: Every day | ORAL | 2 refills | Status: DC

## 2018-12-06 DIAGNOSIS — F17210 Nicotine dependence, cigarettes, uncomplicated: Secondary | ICD-10-CM | POA: Diagnosis not present

## 2018-12-06 DIAGNOSIS — Z716 Tobacco abuse counseling: Secondary | ICD-10-CM | POA: Diagnosis not present

## 2018-12-06 DIAGNOSIS — J302 Other seasonal allergic rhinitis: Secondary | ICD-10-CM | POA: Diagnosis not present

## 2018-12-06 DIAGNOSIS — E668 Other obesity: Secondary | ICD-10-CM | POA: Diagnosis not present

## 2018-12-06 DIAGNOSIS — Z309 Encounter for contraceptive management, unspecified: Secondary | ICD-10-CM | POA: Diagnosis not present

## 2018-12-13 ENCOUNTER — Other Ambulatory Visit: Payer: Self-pay

## 2018-12-13 DIAGNOSIS — Z20822 Contact with and (suspected) exposure to covid-19: Secondary | ICD-10-CM

## 2018-12-15 LAB — NOVEL CORONAVIRUS, NAA: SARS-CoV-2, NAA: NOT DETECTED

## 2019-01-08 DIAGNOSIS — Z6836 Body mass index (BMI) 36.0-36.9, adult: Secondary | ICD-10-CM | POA: Diagnosis not present

## 2019-01-08 DIAGNOSIS — E7849 Other hyperlipidemia: Secondary | ICD-10-CM | POA: Diagnosis not present

## 2019-01-08 DIAGNOSIS — E668 Other obesity: Secondary | ICD-10-CM | POA: Diagnosis not present

## 2019-01-08 DIAGNOSIS — Z131 Encounter for screening for diabetes mellitus: Secondary | ICD-10-CM | POA: Diagnosis not present

## 2019-01-08 DIAGNOSIS — E559 Vitamin D deficiency, unspecified: Secondary | ICD-10-CM | POA: Diagnosis not present

## 2019-01-08 DIAGNOSIS — Z0000 Encounter for general adult medical examination without abnormal findings: Secondary | ICD-10-CM | POA: Diagnosis not present

## 2019-01-08 DIAGNOSIS — F17210 Nicotine dependence, cigarettes, uncomplicated: Secondary | ICD-10-CM | POA: Diagnosis not present

## 2019-01-08 DIAGNOSIS — Z716 Tobacco abuse counseling: Secondary | ICD-10-CM | POA: Diagnosis not present

## 2019-02-19 DIAGNOSIS — E7849 Other hyperlipidemia: Secondary | ICD-10-CM | POA: Diagnosis not present

## 2019-02-19 DIAGNOSIS — E668 Other obesity: Secondary | ICD-10-CM | POA: Diagnosis not present

## 2019-02-19 DIAGNOSIS — Z716 Tobacco abuse counseling: Secondary | ICD-10-CM | POA: Diagnosis not present

## 2019-02-19 DIAGNOSIS — J42 Unspecified chronic bronchitis: Secondary | ICD-10-CM | POA: Diagnosis not present

## 2019-02-19 DIAGNOSIS — F17210 Nicotine dependence, cigarettes, uncomplicated: Secondary | ICD-10-CM | POA: Diagnosis not present

## 2019-02-19 DIAGNOSIS — R030 Elevated blood-pressure reading, without diagnosis of hypertension: Secondary | ICD-10-CM | POA: Diagnosis not present

## 2019-02-19 DIAGNOSIS — Z6835 Body mass index (BMI) 35.0-35.9, adult: Secondary | ICD-10-CM | POA: Diagnosis not present

## 2019-03-12 ENCOUNTER — Other Ambulatory Visit: Payer: Self-pay

## 2019-03-12 DIAGNOSIS — Z20822 Contact with and (suspected) exposure to covid-19: Secondary | ICD-10-CM

## 2019-03-14 LAB — NOVEL CORONAVIRUS, NAA: SARS-CoV-2, NAA: NOT DETECTED

## 2019-04-26 DIAGNOSIS — F17210 Nicotine dependence, cigarettes, uncomplicated: Secondary | ICD-10-CM | POA: Diagnosis not present

## 2019-04-26 DIAGNOSIS — E668 Other obesity: Secondary | ICD-10-CM | POA: Diagnosis not present

## 2019-04-26 DIAGNOSIS — Z716 Tobacco abuse counseling: Secondary | ICD-10-CM | POA: Diagnosis not present

## 2019-04-26 DIAGNOSIS — Z1239 Encounter for other screening for malignant neoplasm of breast: Secondary | ICD-10-CM | POA: Diagnosis not present

## 2019-05-01 ENCOUNTER — Other Ambulatory Visit: Payer: Self-pay | Admitting: Internal Medicine

## 2019-05-01 DIAGNOSIS — Z1231 Encounter for screening mammogram for malignant neoplasm of breast: Secondary | ICD-10-CM

## 2019-05-30 DIAGNOSIS — E7849 Other hyperlipidemia: Secondary | ICD-10-CM | POA: Diagnosis not present

## 2019-05-30 DIAGNOSIS — Z6835 Body mass index (BMI) 35.0-35.9, adult: Secondary | ICD-10-CM | POA: Diagnosis not present

## 2019-05-30 DIAGNOSIS — E668 Other obesity: Secondary | ICD-10-CM | POA: Diagnosis not present

## 2019-05-30 DIAGNOSIS — Z716 Tobacco abuse counseling: Secondary | ICD-10-CM | POA: Diagnosis not present

## 2019-05-30 DIAGNOSIS — M25569 Pain in unspecified knee: Secondary | ICD-10-CM | POA: Diagnosis not present

## 2019-05-30 DIAGNOSIS — F17210 Nicotine dependence, cigarettes, uncomplicated: Secondary | ICD-10-CM | POA: Diagnosis not present

## 2019-05-30 DIAGNOSIS — J42 Unspecified chronic bronchitis: Secondary | ICD-10-CM | POA: Diagnosis not present

## 2019-06-14 ENCOUNTER — Other Ambulatory Visit: Payer: Self-pay

## 2019-06-14 ENCOUNTER — Ambulatory Visit
Admission: RE | Admit: 2019-06-14 | Discharge: 2019-06-14 | Disposition: A | Discharge status: Home / Self Care | Payer: BC Managed Care – PPO | Source: Ambulatory Visit | Attending: Internal Medicine | Admitting: Internal Medicine

## 2019-06-14 DIAGNOSIS — Z1231 Encounter for screening mammogram for malignant neoplasm of breast: Secondary | ICD-10-CM

## 2019-06-18 ENCOUNTER — Other Ambulatory Visit: Payer: Self-pay | Admitting: Internal Medicine

## 2019-06-18 DIAGNOSIS — R928 Other abnormal and inconclusive findings on diagnostic imaging of breast: Secondary | ICD-10-CM

## 2019-06-28 ENCOUNTER — Ambulatory Visit
Admission: RE | Admit: 2019-06-28 | Discharge: 2019-06-28 | Disposition: A | Discharge status: Home / Self Care | Payer: BC Managed Care – PPO | Source: Ambulatory Visit | Attending: Internal Medicine | Admitting: Internal Medicine

## 2019-06-28 ENCOUNTER — Other Ambulatory Visit: Payer: Self-pay

## 2019-06-28 ENCOUNTER — Other Ambulatory Visit: Payer: Self-pay | Admitting: Internal Medicine

## 2019-06-28 DIAGNOSIS — N6311 Unspecified lump in the right breast, upper outer quadrant: Secondary | ICD-10-CM | POA: Diagnosis not present

## 2019-06-28 DIAGNOSIS — R928 Other abnormal and inconclusive findings on diagnostic imaging of breast: Secondary | ICD-10-CM

## 2019-06-28 DIAGNOSIS — R599 Enlarged lymph nodes, unspecified: Secondary | ICD-10-CM | POA: Diagnosis not present

## 2019-06-28 DIAGNOSIS — N6313 Unspecified lump in the right breast, lower outer quadrant: Secondary | ICD-10-CM | POA: Diagnosis not present

## 2019-07-11 DIAGNOSIS — J302 Other seasonal allergic rhinitis: Secondary | ICD-10-CM | POA: Diagnosis not present

## 2019-07-11 DIAGNOSIS — F17210 Nicotine dependence, cigarettes, uncomplicated: Secondary | ICD-10-CM | POA: Diagnosis not present

## 2019-07-11 DIAGNOSIS — E668 Other obesity: Secondary | ICD-10-CM | POA: Diagnosis not present

## 2019-07-11 DIAGNOSIS — Z716 Tobacco abuse counseling: Secondary | ICD-10-CM | POA: Diagnosis not present

## 2019-07-11 DIAGNOSIS — E7849 Other hyperlipidemia: Secondary | ICD-10-CM | POA: Diagnosis not present

## 2019-08-22 DIAGNOSIS — G5603 Carpal tunnel syndrome, bilateral upper limbs: Secondary | ICD-10-CM | POA: Diagnosis not present

## 2019-08-22 DIAGNOSIS — J42 Unspecified chronic bronchitis: Secondary | ICD-10-CM | POA: Diagnosis not present

## 2019-08-22 DIAGNOSIS — E669 Obesity, unspecified: Secondary | ICD-10-CM | POA: Diagnosis not present

## 2019-08-22 DIAGNOSIS — E7849 Other hyperlipidemia: Secondary | ICD-10-CM | POA: Diagnosis not present

## 2019-08-22 DIAGNOSIS — Z6834 Body mass index (BMI) 34.0-34.9, adult: Secondary | ICD-10-CM | POA: Diagnosis not present

## 2019-11-11 DIAGNOSIS — G5602 Carpal tunnel syndrome, left upper limb: Secondary | ICD-10-CM | POA: Diagnosis not present

## 2019-11-11 DIAGNOSIS — M79642 Pain in left hand: Secondary | ICD-10-CM | POA: Diagnosis not present

## 2019-11-13 DIAGNOSIS — M67419 Ganglion, unspecified shoulder: Secondary | ICD-10-CM | POA: Diagnosis not present

## 2019-11-13 DIAGNOSIS — E668 Other obesity: Secondary | ICD-10-CM | POA: Diagnosis not present

## 2019-11-13 DIAGNOSIS — J42 Unspecified chronic bronchitis: Secondary | ICD-10-CM | POA: Diagnosis not present

## 2019-11-13 DIAGNOSIS — E7849 Other hyperlipidemia: Secondary | ICD-10-CM | POA: Diagnosis not present

## 2019-11-13 DIAGNOSIS — Z6834 Body mass index (BMI) 34.0-34.9, adult: Secondary | ICD-10-CM | POA: Diagnosis not present

## 2019-11-13 DIAGNOSIS — Z0000 Encounter for general adult medical examination without abnormal findings: Secondary | ICD-10-CM | POA: Diagnosis not present

## 2019-11-19 ENCOUNTER — Ambulatory Visit (INDEPENDENT_AMBULATORY_CARE_PROVIDER_SITE_OTHER): Payer: BC Managed Care – PPO | Admitting: Obstetrics

## 2019-11-19 ENCOUNTER — Encounter: Payer: Self-pay | Admitting: Obstetrics

## 2019-11-19 ENCOUNTER — Other Ambulatory Visit (HOSPITAL_COMMUNITY)
Admission: RE | Admit: 2019-11-19 | Discharge: 2019-11-19 | Disposition: A | Discharge status: Home / Self Care | Payer: BC Managed Care – PPO | Source: Ambulatory Visit | Attending: Obstetrics | Admitting: Obstetrics

## 2019-11-19 ENCOUNTER — Other Ambulatory Visit: Payer: Self-pay

## 2019-11-19 VITALS — Ht 65.0 in | Wt 209.8 lb

## 2019-11-19 DIAGNOSIS — Z3041 Encounter for surveillance of contraceptive pills: Secondary | ICD-10-CM | POA: Diagnosis not present

## 2019-11-19 DIAGNOSIS — Z6834 Body mass index (BMI) 34.0-34.9, adult: Secondary | ICD-10-CM

## 2019-11-19 DIAGNOSIS — Z01419 Encounter for gynecological examination (general) (routine) without abnormal findings: Secondary | ICD-10-CM | POA: Insufficient documentation

## 2019-11-19 DIAGNOSIS — Z01411 Encounter for gynecological examination (general) (routine) with abnormal findings: Secondary | ICD-10-CM

## 2019-11-19 DIAGNOSIS — E66811 Obesity, class 1: Secondary | ICD-10-CM

## 2019-11-19 DIAGNOSIS — E669 Obesity, unspecified: Secondary | ICD-10-CM

## 2019-11-19 DIAGNOSIS — F17200 Nicotine dependence, unspecified, uncomplicated: Secondary | ICD-10-CM

## 2019-11-19 DIAGNOSIS — N944 Primary dysmenorrhea: Secondary | ICD-10-CM

## 2019-11-19 MED ORDER — BUPROPION HCL ER (SMOKING DET) 150 MG PO TB12: 150.0000 mg | ORAL_TABLET | Freq: Two times a day (BID) | ORAL | 2 refills | Status: DC

## 2019-11-19 MED ORDER — NORGESTIMATE-ETH ESTRADIOL 0.25-35 MG-MCG PO TABS: 1.0000 | ORAL_TABLET | Freq: Every day | ORAL | 11 refills | Status: DC

## 2019-11-19 MED ORDER — IBUPROFEN 800 MG PO TABS: ORAL_TABLET | ORAL | 5 refills | Status: DC

## 2019-11-19 MED ORDER — ESTARYLLA 0.25-35 MG-MCG PO TABS: 1.0000 | ORAL_TABLET | Freq: Every day | ORAL | 11 refills | Status: DC

## 2019-11-19 NOTE — Progress Notes (Signed)
Patient presents for an AEX. Patient has no concerns today. She does not want any additional testing.  Last Pap: 11/15/2017 Normal Last MM: 06/14/19 U/S L and R breast 06/28/19

## 2019-11-19 NOTE — Progress Notes (Signed)
Subjective:        Zoe Hernandez is a 38 y.o. female here for a routine exam.  Current complaints: None.    Personal health questionnaire:  Is patient [name], have a family history of breast and/or ovarian cancer: yes Is there a family history of uterine cancer diagnosed at age < 63, gastrointestinal cancer, urinary tract cancer, family member who is a Personnel officer syndrome-associated carrier: no Is the patient overweight and hypertensive, family history of diabetes, personal history of gestational diabetes, preeclampsia or PCOS: no Is patient over 63, have PCOS,  family history of premature CHD under age 29, diabetes, smoke, have hypertension or peripheral artery disease:  yes At any time, has a partner hit, kicked or otherwise hurt or frightened you?: no Over the past 2 weeks, have you felt down, depressed or hopeless?: no Over the past 2 weeks, have you felt little interest or pleasure in doing things?:no   Gynecologic History Patient's last menstrual period was 10/25/2019. Contraception: OCP (estrogen/progesterone) Last Pap: 2019. Results were: normal Last mammogram: 2021. Results were: normal  Obstetric History OB History  Gravida Para Term Preterm AB Living  3 1 1   2 1   SAB TAB Ectopic Multiple Live Births    2     1    # Outcome Date GA Lbr Len/2nd Weight Sex Delivery Anes PTL Lv  3 Term 12/19/00     Vag-Spont   LIV  2 TAB           1 TAB             Past Medical History:  Diagnosis Date   Carpal tunnel syndrome of right wrist 07/2016    Past Surgical History:  Procedure Laterality Date   CARPAL TUNNEL RELEASE Right 08/11/2016   Procedure: RIGHT CARPAL TUNNEL RELEASE;  Surgeon: 08/13/2016, MD;  Location: [facility];  Service: Orthopedics;  Laterality: Right;   CHOLECYSTECTOMY     TONSILLECTOMY     WISDOM TOOTH EXTRACTION  10/11/2012     Current Outpatient Medications:    albuterol (PROVENTIL HFA;VENTOLIN HFA) 108 (90 Base)  MCG/ACT inhaler, Inhale into the lungs., Disp: , Rfl:    ibuprofen (ADVIL) 800 MG tablet, TK 1 TABLET PO TID PRN, Disp: 30 tablet, Rfl: 5   phentermine 37.5 MG capsule, Take by mouth., Disp: , Rfl:    Vitamin D, Ergocalciferol, (DRISDOL) 1.25 MG (50000 UNIT) CAPS capsule, Take 50,000 Units by mouth once a week., Disp: , Rfl:    buPROPion (ZYBAN) 150 MG 12 hr tablet, Take 1 tablet (150 mg total) by mouth 2 (two) times daily., Disp: 60 tablet, Rfl: 2   norgestimate-ethinyl estradiol (ORTHO-CYCLEN) 0.25-35 MG-MCG tablet, Take 1 tablet by mouth daily., Disp: 28 tablet, Rfl: 11 No Known Allergies  Social History   Tobacco Use   Smoking status: Current Every Day Smoker    Years: 15.00    Types: Cigars   Smokeless tobacco: Never Used   Tobacco comment: 1 cigar/day  Substance Use Topics   Alcohol use: Yes    Comment: socially    Family History  Problem Relation Age of Onset   Hypertension Mother    Heart disease Mother    Breast cancer Mother 39   Hypertension Father    Diabetes Father    Lung cancer Maternal Grandmother       Review of Systems  Constitutional: negative for fatigue and weight loss Respiratory: negative for cough and wheezing Cardiovascular: negative  for chest pain, fatigue and palpitations Gastrointestinal: negative for abdominal pain and change in bowel habits Musculoskeletal:negative for myalgias Neurological: negative for gait problems and tremors Behavioral/Psych: negative for abusive relationship, depression Endocrine: negative for temperature intolerance    Genitourinary:negative for abnormal menstrual periods, genital lesions, hot flashes, sexual problems and vaginal discharge Integument/breast: negative for breast lump, breast tenderness, nipple discharge and skin lesion(s)    Objective:       Ht 5\' 5"  (1.651 m)    Wt 209 lb 12.8 oz (95.2 kg)    LMP 10/25/2019    BMI 34.91 kg/m  General:   alert  Skin:   no rash or abnormalities   Lungs:   clear to auscultation bilaterally  Heart:   regular rate and rhythm, S1, S2 normal, no murmur, click, rub or gallop  Breasts:   normal without suspicious masses, skin or nipple changes or axillary nodes  Abdomen:  normal findings: no organomegaly, soft, non-tender and no hernia  Pelvis:  External genitalia: normal general appearance Urinary system: urethral meatus normal and bladder without fullness, nontender Vaginal: normal without tenderness, induration or masses Cervix: normal appearance Adnexa: normal bimanual exam Uterus: anteverted and non-tender, normal size   Lab Review Urine pregnancy test Labs reviewed yes Radiologic studies reviewed yes  50% of 20 min visit spent on counseling and coordination of care.   Assessment:     1. Encounter for routine gynecological examination with Papanicolaou smear of cervix Rx: - Cytology - PAP( [facility])  2. Encounter for surveillance of contraceptive pill - still smoking cigars - explained the risks of tobacco with taking OCP's at > age 50.  Recommended medication to help stop smoking, and she agreed to medication Rx: - norgestimate-ethinyl estradiol (ORTHO-CYCLEN) 0.25-35 MG-MCG tablet; Take 1 tablet by mouth daily.  Dispense: 28 tablet; Refill: 11  3. Primary dysmenorrhea Rx: - ibuprofen (ADVIL) 800 MG tablet; TK 1 TABLET PO TID PRN  Dispense: 30 tablet; Refill: 5  4. Tobacco dependence Rx: - buPROPion (ZYBAN) 150 MG 12 hr tablet; Take 1 tablet (150 mg total) by mouth 2 (two) times daily.  Dispense: 60 tablet; Refill: 2  5. Obesity (BMI 30.0-34.9) - program of caloric reduction, exercise and behavioral modification recommended    Plan:    Education reviewed: calcium supplements, depression evaluation, low fat, low cholesterol diet, safe sex/STD prevention, self breast exams, smoking cessation and weight bearing exercise. Contraception: OCP (estrogen/progesterone). Follow up in: 3 months.   Meds ordered this  encounter  Medications   DISCONTD: ESTARYLLA 0.25-35 MG-MCG tablet    Sig: Take 1 tablet by mouth daily.    Dispense:  28 tablet    Refill:  11   ibuprofen (ADVIL) 800 MG tablet    Sig: TK 1 TABLET PO TID PRN    Dispense:  30 tablet    Refill:  5   norgestimate-ethinyl estradiol (ORTHO-CYCLEN) 0.25-35 MG-MCG tablet    Sig: Take 1 tablet by mouth daily.    Dispense:  28 tablet    Refill:  11   buPROPion (ZYBAN) 150 MG 12 hr tablet    Sig: Take 1 tablet (150 mg total) by mouth 2 (two) times daily.    Dispense:  60 tablet    Refill:  2     01-[birth year]1999, MD 11/19/2019 9:39 AM

## 2019-11-21 LAB — CYTOLOGY - PAP
Comment: NEGATIVE
Diagnosis: NEGATIVE
High risk HPV: NEGATIVE

## 2019-11-27 DIAGNOSIS — M7121 Synovial cyst of popliteal space [Baker], right knee: Secondary | ICD-10-CM | POA: Diagnosis not present

## 2019-11-27 DIAGNOSIS — M79604 Pain in right leg: Secondary | ICD-10-CM | POA: Diagnosis not present

## 2019-12-30 ENCOUNTER — Ambulatory Visit
Admission: RE | Admit: 2019-12-30 | Discharge: 2019-12-30 | Disposition: A | Discharge status: Home / Self Care | Payer: BC Managed Care – PPO | Source: Ambulatory Visit | Attending: Internal Medicine | Admitting: Internal Medicine

## 2019-12-30 ENCOUNTER — Other Ambulatory Visit: Payer: Self-pay

## 2019-12-30 ENCOUNTER — Other Ambulatory Visit: Payer: Self-pay | Admitting: Internal Medicine

## 2019-12-30 DIAGNOSIS — R928 Other abnormal and inconclusive findings on diagnostic imaging of breast: Secondary | ICD-10-CM

## 2019-12-30 DIAGNOSIS — N6489 Other specified disorders of breast: Secondary | ICD-10-CM | POA: Diagnosis not present

## 2020-01-02 DIAGNOSIS — Z6834 Body mass index (BMI) 34.0-34.9, adult: Secondary | ICD-10-CM | POA: Diagnosis not present

## 2020-01-02 DIAGNOSIS — J42 Unspecified chronic bronchitis: Secondary | ICD-10-CM | POA: Diagnosis not present

## 2020-01-02 DIAGNOSIS — E559 Vitamin D deficiency, unspecified: Secondary | ICD-10-CM | POA: Diagnosis not present

## 2020-01-02 DIAGNOSIS — E668 Other obesity: Secondary | ICD-10-CM | POA: Diagnosis not present

## 2020-01-02 DIAGNOSIS — E7849 Other hyperlipidemia: Secondary | ICD-10-CM | POA: Diagnosis not present

## 2020-01-02 DIAGNOSIS — G5603 Carpal tunnel syndrome, bilateral upper limbs: Secondary | ICD-10-CM | POA: Diagnosis not present

## 2020-03-10 DIAGNOSIS — J42 Unspecified chronic bronchitis: Secondary | ICD-10-CM | POA: Diagnosis not present

## 2020-03-10 DIAGNOSIS — E7849 Other hyperlipidemia: Secondary | ICD-10-CM | POA: Diagnosis not present

## 2020-03-10 DIAGNOSIS — Z6835 Body mass index (BMI) 35.0-35.9, adult: Secondary | ICD-10-CM | POA: Diagnosis not present

## 2020-03-10 DIAGNOSIS — J302 Other seasonal allergic rhinitis: Secondary | ICD-10-CM | POA: Diagnosis not present

## 2020-03-10 DIAGNOSIS — E668 Other obesity: Secondary | ICD-10-CM | POA: Diagnosis not present

## 2020-04-07 DIAGNOSIS — M25561 Pain in right knee: Secondary | ICD-10-CM | POA: Diagnosis not present

## 2020-04-07 DIAGNOSIS — M25562 Pain in left knee: Secondary | ICD-10-CM | POA: Diagnosis not present

## 2020-04-09 DIAGNOSIS — M25562 Pain in left knee: Secondary | ICD-10-CM | POA: Diagnosis not present

## 2020-04-09 DIAGNOSIS — M25561 Pain in right knee: Secondary | ICD-10-CM | POA: Diagnosis not present

## 2020-05-15 DIAGNOSIS — M25562 Pain in left knee: Secondary | ICD-10-CM | POA: Diagnosis not present

## 2020-05-19 DIAGNOSIS — M25562 Pain in left knee: Secondary | ICD-10-CM | POA: Diagnosis not present

## 2020-06-30 ENCOUNTER — Other Ambulatory Visit: Payer: BC Managed Care – PPO

## 2020-07-01 ENCOUNTER — Other Ambulatory Visit: Payer: Self-pay

## 2020-07-01 ENCOUNTER — Ambulatory Visit
Admission: RE | Admit: 2020-07-01 | Discharge: 2020-07-01 | Disposition: A | Discharge status: Home / Self Care | Payer: BC Managed Care – PPO | Source: Ambulatory Visit | Attending: Internal Medicine | Admitting: Internal Medicine

## 2020-07-01 DIAGNOSIS — R928 Other abnormal and inconclusive findings on diagnostic imaging of breast: Secondary | ICD-10-CM

## 2020-07-01 DIAGNOSIS — R922 Inconclusive mammogram: Secondary | ICD-10-CM | POA: Diagnosis not present

## 2020-07-01 DIAGNOSIS — N6489 Other specified disorders of breast: Secondary | ICD-10-CM | POA: Diagnosis not present

## 2020-07-07 DIAGNOSIS — M7122 Synovial cyst of popliteal space [Baker], left knee: Secondary | ICD-10-CM | POA: Diagnosis not present

## 2020-07-07 DIAGNOSIS — S83281A Other tear of lateral meniscus, current injury, right knee, initial encounter: Secondary | ICD-10-CM | POA: Diagnosis not present

## 2020-07-23 ENCOUNTER — Other Ambulatory Visit: Payer: Self-pay

## 2020-07-23 ENCOUNTER — Encounter (HOSPITAL_BASED_OUTPATIENT_CLINIC_OR_DEPARTMENT_OTHER): Payer: Self-pay | Admitting: Orthopedic Surgery

## 2020-07-23 NOTE — Progress Notes (Signed)
Spoke w/ via phone for pre-op interview---pt Lab needs dos----  urjne poct             Lab results------none COVID test ------07-28-2020 1315 Arrive at -------1215 pm 07-30-2020 NPO after MN NO Solid Food.  Clear liquids from MN until---1115 pm then npo Med rec completed Medications to take morning of surgery -----use bring inhaler prn Diabetic medication -----n/a Patient instructed to bring photo id and insurance card day of surgery Patient aware to have Driver (ride ) / caregiver spouse otis cooper will stay  for 24 hours after surgery  Patient Special Instructions -----none Pre-Op special Istructions -----none Patient verbalized understanding of instructions that were given at this phone interview. Patient denies shortness of breath, chest pain, fever, cough at this phone interview.

## 2020-07-28 ENCOUNTER — Other Ambulatory Visit (HOSPITAL_COMMUNITY)
Admission: RE | Admit: 2020-07-28 | Discharge: 2020-07-28 | Disposition: A | Discharge status: Home / Self Care | Payer: BC Managed Care – PPO | Source: Ambulatory Visit | Attending: Orthopedic Surgery | Admitting: Orthopedic Surgery

## 2020-07-28 DIAGNOSIS — Z01812 Encounter for preprocedural laboratory examination: Secondary | ICD-10-CM | POA: Insufficient documentation

## 2020-07-28 DIAGNOSIS — S83281A Other tear of lateral meniscus, current injury, right knee, initial encounter: Secondary | ICD-10-CM | POA: Diagnosis not present

## 2020-07-28 DIAGNOSIS — Z20822 Contact with and (suspected) exposure to covid-19: Secondary | ICD-10-CM | POA: Insufficient documentation

## 2020-07-28 DIAGNOSIS — X58XXXA Exposure to other specified factors, initial encounter: Secondary | ICD-10-CM | POA: Diagnosis not present

## 2020-07-28 DIAGNOSIS — F17290 Nicotine dependence, other tobacco product, uncomplicated: Secondary | ICD-10-CM | POA: Diagnosis not present

## 2020-07-28 DIAGNOSIS — Z9049 Acquired absence of other specified parts of digestive tract: Secondary | ICD-10-CM | POA: Diagnosis not present

## 2020-07-28 DIAGNOSIS — Z7982 Long term (current) use of aspirin: Secondary | ICD-10-CM | POA: Diagnosis not present

## 2020-07-28 LAB — SARS CORONAVIRUS 2 (TAT 6-24 HRS): SARS Coronavirus 2: NEGATIVE

## 2020-07-30 ENCOUNTER — Ambulatory Visit (HOSPITAL_BASED_OUTPATIENT_CLINIC_OR_DEPARTMENT_OTHER): Payer: BC Managed Care – PPO | Admitting: Anesthesiology

## 2020-07-30 ENCOUNTER — Ambulatory Visit (HOSPITAL_BASED_OUTPATIENT_CLINIC_OR_DEPARTMENT_OTHER)
Admission: RE | Admit: 2020-07-30 | Discharge: 2020-07-30 | Disposition: A | Discharge status: Home / Self Care | Payer: BC Managed Care – PPO | Attending: Orthopedic Surgery | Admitting: Orthopedic Surgery

## 2020-07-30 ENCOUNTER — Other Ambulatory Visit: Payer: Self-pay

## 2020-07-30 ENCOUNTER — Encounter (HOSPITAL_BASED_OUTPATIENT_CLINIC_OR_DEPARTMENT_OTHER)
Admission: RE | Disposition: A | Discharge status: Home / Self Care | Payer: Self-pay | Source: Home / Self Care | Attending: Orthopedic Surgery

## 2020-07-30 ENCOUNTER — Encounter (HOSPITAL_BASED_OUTPATIENT_CLINIC_OR_DEPARTMENT_OTHER): Payer: Self-pay | Admitting: Orthopedic Surgery

## 2020-07-30 DIAGNOSIS — Z9049 Acquired absence of other specified parts of digestive tract: Secondary | ICD-10-CM | POA: Insufficient documentation

## 2020-07-30 DIAGNOSIS — F17290 Nicotine dependence, other tobacco product, uncomplicated: Secondary | ICD-10-CM | POA: Diagnosis not present

## 2020-07-30 DIAGNOSIS — Z20822 Contact with and (suspected) exposure to covid-19: Secondary | ICD-10-CM | POA: Diagnosis not present

## 2020-07-30 DIAGNOSIS — S83281A Other tear of lateral meniscus, current injury, right knee, initial encounter: Secondary | ICD-10-CM | POA: Diagnosis not present

## 2020-07-30 DIAGNOSIS — M65861 Other synovitis and tenosynovitis, right lower leg: Secondary | ICD-10-CM | POA: Diagnosis not present

## 2020-07-30 DIAGNOSIS — X58XXXA Exposure to other specified factors, initial encounter: Secondary | ICD-10-CM | POA: Insufficient documentation

## 2020-07-30 DIAGNOSIS — D649 Anemia, unspecified: Secondary | ICD-10-CM | POA: Diagnosis not present

## 2020-07-30 DIAGNOSIS — Z7982 Long term (current) use of aspirin: Secondary | ICD-10-CM | POA: Diagnosis not present

## 2020-07-30 DIAGNOSIS — M94261 Chondromalacia, right knee: Secondary | ICD-10-CM | POA: Diagnosis not present

## 2020-07-30 DIAGNOSIS — K219 Gastro-esophageal reflux disease without esophagitis: Secondary | ICD-10-CM | POA: Diagnosis not present

## 2020-07-30 HISTORY — DX: Unspecified dislocation of right knee, initial encounter: S83.104A

## 2020-07-30 HISTORY — DX: Gastro-esophageal reflux disease without esophagitis: K21.9

## 2020-07-30 HISTORY — DX: Anemia, unspecified: D64.9

## 2020-07-30 HISTORY — PX: KNEE ARTHROSCOPY WITH LATERAL MENISECTOMY: SHX6193

## 2020-07-30 HISTORY — DX: Carpal tunnel syndrome, left upper limb: G56.02

## 2020-07-30 LAB — POCT PREGNANCY, URINE: Preg Test, Ur: NEGATIVE

## 2020-07-30 SURGERY — ARTHROSCOPY, KNEE, WITH LATERAL MENISCECTOMY
Anesthesia: General | Site: Knee | Laterality: Right

## 2020-07-30 MED ORDER — SODIUM CHLORIDE 0.9 % IR SOLN
Status: DC | PRN
  Administered 2020-07-30: 3000 mL

## 2020-07-30 MED ORDER — CEFAZOLIN SODIUM-DEXTROSE 2-4 GM/100ML-% IV SOLN
2.0000 g | INTRAVENOUS | Status: AC
  Administered 2020-07-30: 2 g via INTRAVENOUS

## 2020-07-30 MED ORDER — KETOROLAC TROMETHAMINE 30 MG/ML IJ SOLN
INTRAMUSCULAR | Status: AC
  Filled 2020-07-30: qty 1

## 2020-07-30 MED ORDER — KETOROLAC TROMETHAMINE 30 MG/ML IJ SOLN
INTRAMUSCULAR | Status: DC | PRN
  Administered 2020-07-30: 30 mg via INTRAVENOUS

## 2020-07-30 MED ORDER — ACETAMINOPHEN 325 MG PO TABS: 325.0000 mg | ORAL_TABLET | ORAL | Status: DC | PRN

## 2020-07-30 MED ORDER — OXYCODONE HCL 5 MG PO TABS
ORAL_TABLET | ORAL | Status: AC
  Filled 2020-07-30: qty 1

## 2020-07-30 MED ORDER — BUPIVACAINE HCL 0.25 % IJ SOLN
INTRAMUSCULAR | Status: DC | PRN
  Administered 2020-07-30: 17 mL

## 2020-07-30 MED ORDER — HYDROCODONE-ACETAMINOPHEN 7.5-325 MG PO TABS: 1.0000 | ORAL_TABLET | Freq: Four times a day (QID) | ORAL | 0 refills | Status: DC | PRN

## 2020-07-30 MED ORDER — LIDOCAINE 2% (20 MG/ML) 5 ML SYRINGE
INTRAMUSCULAR | Status: AC
  Filled 2020-07-30: qty 5

## 2020-07-30 MED ORDER — ONDANSETRON HCL 4 MG/2ML IJ SOLN
INTRAMUSCULAR | Status: DC | PRN
  Administered 2020-07-30: 4 mg via INTRAVENOUS

## 2020-07-30 MED ORDER — FENTANYL CITRATE (PF) 100 MCG/2ML IJ SOLN: 25.0000 ug | INTRAMUSCULAR | Status: DC | PRN

## 2020-07-30 MED ORDER — MIDAZOLAM HCL 2 MG/2ML IJ SOLN
INTRAMUSCULAR | Status: DC | PRN
  Administered 2020-07-30: 2 mg via INTRAVENOUS

## 2020-07-30 MED ORDER — OXYCODONE HCL 5 MG/5ML PO SOLN: 5.0000 mg | Freq: Once | ORAL | Status: AC | PRN

## 2020-07-30 MED ORDER — CEFAZOLIN SODIUM-DEXTROSE 2-4 GM/100ML-% IV SOLN
INTRAVENOUS | Status: AC
  Filled 2020-07-30: qty 100

## 2020-07-30 MED ORDER — OXYCODONE HCL 5 MG PO TABS
5.0000 mg | ORAL_TABLET | Freq: Once | ORAL | Status: AC | PRN
  Administered 2020-07-30: 5 mg via ORAL

## 2020-07-30 MED ORDER — LIDOCAINE 2% (20 MG/ML) 5 ML SYRINGE
INTRAMUSCULAR | Status: DC | PRN
  Administered 2020-07-30: 60 mg via INTRAVENOUS

## 2020-07-30 MED ORDER — FENTANYL CITRATE (PF) 100 MCG/2ML IJ SOLN
INTRAMUSCULAR | Status: DC | PRN
  Administered 2020-07-30: 25 ug via INTRAVENOUS
  Administered 2020-07-30: 50 ug via INTRAVENOUS
  Administered 2020-07-30: 25 ug via INTRAVENOUS

## 2020-07-30 MED ORDER — FENTANYL CITRATE (PF) 100 MCG/2ML IJ SOLN
INTRAMUSCULAR | Status: AC
  Filled 2020-07-30: qty 2

## 2020-07-30 MED ORDER — ONDANSETRON HCL 4 MG PO TABS: 4.0000 mg | ORAL_TABLET | Freq: Every day | ORAL | 1 refills | Status: DC | PRN

## 2020-07-30 MED ORDER — LACTATED RINGERS IV SOLN: INTRAVENOUS | Status: DC

## 2020-07-30 MED ORDER — ACETAMINOPHEN 160 MG/5ML PO SOLN
325.0000 mg | ORAL | Status: DC | PRN
Start: 2020-07-30 — End: 2020-07-30

## 2020-07-30 MED ORDER — DEXAMETHASONE SODIUM PHOSPHATE 10 MG/ML IJ SOLN
INTRAMUSCULAR | Status: AC
  Filled 2020-07-30: qty 1

## 2020-07-30 MED ORDER — ONDANSETRON HCL 4 MG/2ML IJ SOLN
INTRAMUSCULAR | Status: AC
  Filled 2020-07-30: qty 2

## 2020-07-30 MED ORDER — MEPERIDINE HCL 25 MG/ML IJ SOLN: 6.2500 mg | INTRAMUSCULAR | Status: DC | PRN

## 2020-07-30 MED ORDER — ONDANSETRON HCL 4 MG/2ML IJ SOLN: 4.0000 mg | Freq: Once | INTRAMUSCULAR | Status: DC | PRN

## 2020-07-30 MED ORDER — MIDAZOLAM HCL 2 MG/2ML IJ SOLN
INTRAMUSCULAR | Status: AC
  Filled 2020-07-30: qty 2

## 2020-07-30 MED ORDER — PROPOFOL 10 MG/ML IV BOLUS
INTRAVENOUS | Status: DC | PRN
  Administered 2020-07-30: 200 mg via INTRAVENOUS

## 2020-07-30 MED ORDER — DEXAMETHASONE SODIUM PHOSPHATE 10 MG/ML IJ SOLN
INTRAMUSCULAR | Status: DC | PRN
  Administered 2020-07-30: 10 mg via INTRAVENOUS

## 2020-07-30 MED ORDER — PROPOFOL 10 MG/ML IV BOLUS
INTRAVENOUS | Status: AC
  Filled 2020-07-30: qty 20

## 2020-07-30 SURGICAL SUPPLY — 35 items
ABLATOR ASPIRATE 50D MULTI-PRT (SURGICAL WAND) IMPLANT
BANDAGE ESMARK 6X9 LF (GAUZE/BANDAGES/DRESSINGS) IMPLANT
BLADE SHAVER TORPEDO 4X13 (MISCELLANEOUS) IMPLANT
BNDG CMPR 9X6 STRL LF SNTH (GAUZE/BANDAGES/DRESSINGS)
BNDG ELASTIC 4X5.8 VLCR STR LF (GAUZE/BANDAGES/DRESSINGS) ×2 IMPLANT
BNDG ELASTIC 6X5.8 VLCR STR LF (GAUZE/BANDAGES/DRESSINGS) ×2 IMPLANT
BNDG ESMARK 6X9 LF (GAUZE/BANDAGES/DRESSINGS)
COVER WAND RF STERILE (DRAPES) ×2 IMPLANT
CUFF TOURN SGL QUICK 34 (TOURNIQUET CUFF)
CUFF TRNQT CYL 34X4.125X (TOURNIQUET CUFF) IMPLANT
DRAPE ARTHROSCOPY W/POUCH 114 (DRAPES) ×2 IMPLANT
DRAPE U-SHAPE 47X51 STRL (DRAPES) ×2 IMPLANT
DRSG PAD ABDOMINAL 8X10 ST (GAUZE/BANDAGES/DRESSINGS) ×2 IMPLANT
DURAPREP 26ML APPLICATOR (WOUND CARE) ×2 IMPLANT
GAUZE SPONGE 4X4 12PLY STRL (GAUZE/BANDAGES/DRESSINGS) ×2 IMPLANT
GAUZE XEROFORM 1X8 LF (GAUZE/BANDAGES/DRESSINGS) IMPLANT
GLOVE SURG ENC MOIS LTX SZ7.5 (GLOVE) ×4 IMPLANT
GLOVE SURG UNDER LTX SZ8 (GLOVE) ×4 IMPLANT
GOWN STRL REUS W/TWL XL LVL3 (GOWN DISPOSABLE) ×4 IMPLANT
IV NS IRRIG 3000ML ARTHROMATIC (IV SOLUTION) ×4 IMPLANT
KIT TURNOVER CYSTO (KITS) ×2 IMPLANT
MANIFOLD NEPTUNE II (INSTRUMENTS) ×2 IMPLANT
PACK ARTHROSCOPY DSU (CUSTOM PROCEDURE TRAY) ×2 IMPLANT
PACK BASIN DAY SURGERY FS (CUSTOM PROCEDURE TRAY) ×2 IMPLANT
PAD ARMBOARD 7.5X6 YLW CONV (MISCELLANEOUS) IMPLANT
PADDING CAST COTTON 6X4 STRL (CAST SUPPLIES) ×1 IMPLANT
PORT APPOLLO RF 90DEGREE MULTI (SURGICAL WAND) IMPLANT
STRIP CLOSURE SKIN 1/2X4 (GAUZE/BANDAGES/DRESSINGS) ×1 IMPLANT
SUT ETHILON 3 0 PS 1 (SUTURE) IMPLANT
SUT MNCRL AB 3-0 PS2 27 (SUTURE) IMPLANT
TOWEL OR 17X26 10 PK STRL BLUE (TOWEL DISPOSABLE) ×2 IMPLANT
TUBE CONNECTING 12X1/4 (SUCTIONS) ×4 IMPLANT
TUBING ARTHROSCOPY IRRIG 16FT (MISCELLANEOUS) ×2 IMPLANT
WATER STERILE IRR 500ML POUR (IV SOLUTION) ×2 IMPLANT
WRAP KNEE MAXI GEL POST OP (GAUZE/BANDAGES/DRESSINGS) ×1 IMPLANT

## 2020-07-30 NOTE — Transfer of Care (Signed)
Immediate Anesthesia Transfer of Care Note  Patient: Zoe Hernandez  Procedure(s) Performed: Procedure(s) (LRB): KNEE ARTHROSCOPY WITH PARTIAL LATERAL MENISECTOMY (Right)  Patient Location: PACU  Anesthesia Type: General  Level of Consciousness: awake, alert  and oriented  Airway & Oxygen Therapy: Patient Spontanous Breathing and Patient connected to nasal cannula oxygen  Post-op Assessment: Report given to PACU RN and Post -op Vital signs reviewed and stable  Post vital signs: Reviewed and stable  Complications: No apparent anesthesia complications  Last Vitals:  Vitals Value Taken Time  BP 130/83 07/30/20 1437  Temp    Pulse 99 07/30/20 1441  Resp 25 07/30/20 1441  SpO2 97 % 07/30/20 1441  Vitals shown include unvalidated device data.  Last Pain:  Vitals:   07/30/20 1237  TempSrc: Oral  PainSc: 0-No pain         Complications: No complications documented.

## 2020-07-30 NOTE — Brief Op Note (Signed)
07/30/2020  2:26 PM  PATIENT:  Zoe Hernandez  40 y.o. female  PRE-OPERATIVE DIAGNOSIS:  Right knee lateral meniscus tear  POST-OPERATIVE DIAGNOSIS:  Right knee lateral meniscus tear  PROCEDURE:  Procedure(s) with comments: KNEE ARTHROSCOPY WITH PARTIAL LATERAL MENISECTOMY (Right) - 60 MINS  SURGEON:  Surgeon(s) and Role:    * Aundria Rud, Noah Delaine, MD - Primary  PHYSICIAN ASSISTANT: Dion Saucier, PA-C   ANESTHESIA:   local and general  EBL:  10 mL   BLOOD ADMINISTERED:none  DRAINS: none   LOCAL MEDICATIONS USED:  MARCAINE     SPECIMEN:  No Specimen  DISPOSITION OF SPECIMEN:  N/A  COUNTS:  YES  TOURNIQUET:  * No tourniquets in log *  DICTATION: .Note written in EPIC  PLAN OF CARE: Discharge to home after PACU  PATIENT DISPOSITION:  PACU - hemodynamically stable.   Delay start of Pharmacological VTE agent (>24hrs) due to surgical blood loss or risk of bleeding: not applicable

## 2020-07-30 NOTE — Anesthesia Preprocedure Evaluation (Addendum)
Anesthesia Evaluation  Patient identified by MRN, date of birth, ID band Patient awake    Reviewed: Allergy & Precautions, NPO status , Patient's Chart, lab work & pertinent test results  History of Anesthesia Complications Negative for: history of anesthetic complications  Airway Mallampati: II  TM Distance: >3 FB Neck ROM: Full    Dental no notable dental hx. (+) Dental Advisory Given   Pulmonary Current Smoker and Patient abstained from smoking.,    Pulmonary exam normal        Cardiovascular Normal cardiovascular exam     Neuro/Psych    GI/Hepatic GERD  ,  Endo/Other  Morbid obesity  Renal/GU      Musculoskeletal   Abdominal   Peds negative pediatric ROS (+)  Hematology  (+) Blood dyscrasia, anemia ,   Anesthesia Other Findings   Reproductive/Obstetrics                            Anesthesia Physical  Anesthesia Plan  ASA: II  Anesthesia Plan: General   Post-op Pain Management:    Induction: Intravenous  PONV Risk Score and Plan: 3 and Ondansetron, Dexamethasone, Treatment may vary due to age or medical condition and Midazolam  Airway Management Planned: LMA  Additional Equipment: None  Intra-op Plan:   Post-operative Plan: Extubation in OR  Informed Consent: I have reviewed the patients History and Physical, chart, labs and discussed the procedure including the risks, benefits and alternatives for the proposed anesthesia with the patient or authorized representative who has indicated his/her understanding and acceptance.     Dental advisory given  Plan Discussed with: CRNA, Anesthesiologist and Surgeon  Anesthesia Plan Comments: ( )       Anesthesia Quick Evaluation

## 2020-07-30 NOTE — Anesthesia Procedure Notes (Signed)
Procedure Name: LMA Insertion Date/Time: 07/30/2020 1:52 PM Performed by: Norva Pavlov, CRNA Pre-anesthesia Checklist: Patient identified, Emergency Drugs available, Suction available and Patient being monitored Patient Re-evaluated:Patient Re-evaluated prior to induction Oxygen Delivery Method: Circle system utilized Preoxygenation: Pre-oxygenation with 100% oxygen Induction Type: IV induction Ventilation: Mask ventilation without difficulty LMA: LMA inserted LMA Size: 4.0 Number of attempts: 1 Airway Equipment and Method: Bite block Placement Confirmation: positive ETCO2 Tube secured with: Tape Dental Injury: Teeth and Oropharynx as per pre-operative assessment

## 2020-07-30 NOTE — Anesthesia Postprocedure Evaluation (Signed)
Anesthesia Post Note  Patient: Laiklyn A Pittinger  Procedure(s) Performed: KNEE ARTHROSCOPY WITH PARTIAL LATERAL MENISECTOMY (Right Knee)     Patient location during evaluation: PACU Anesthesia Type: General Level of consciousness: awake and alert Pain management: pain level controlled Vital Signs Assessment: post-procedure vital signs reviewed and stable Respiratory status: spontaneous breathing, nonlabored ventilation, respiratory function stable and patient connected to nasal cannula oxygen Cardiovascular status: blood pressure returned to baseline and stable Postop Assessment: no apparent nausea or vomiting Anesthetic complications: no   No complications documented.  Last Vitals:  Vitals:   07/30/20 1237 07/30/20 1437  BP: (!) 152/92 130/83  Pulse: 76 92  Resp: 17 19  Temp: 37.1 C 36.7 C  SpO2: 100% 100%    Last Pain:  Vitals:   07/30/20 1437  TempSrc:   PainSc: 5                  Naidelyn Parrella

## 2020-07-30 NOTE — Op Note (Signed)
Surgery Date: 07/30/2020  Surgeon(s): Yolonda Kida, MD   Assistant: Dion Saucier, PA-C  Assistant attestation:  PA Mcclung was utilized throughout the procedure for positioning patient, manipulating the knee, performing meniscectomy and debridement, and closure of wounds.  He was present for the entire procedure.  ANESTHESIA:  general, local  FLUIDS: Per anesthesia record.   ESTIMATED BLOOD LOSS: minimal  PREOPERATIVE DIAGNOSES:  1.  Right knee lateral meniscus tear 2.  Right  knee synovitis  POSTOPERATIVE DIAGNOSES:  same  PROCEDURES PERFORMED:  1.  Right knee arthroscopy with major synovectomy, anterior medial and anterior lateral compartments 2.  Right  knee arthroscopy with arthroscopic partial lateral meniscectomy   DESCRIPTION OF PROCEDURE: Ms. Witte is a 40 y.o.-year-old female with right knee lateral meniscus tear. Plans are to proceed with partial lateral meniscectomy and diagnostic arthroscopy with debridement as indicated. Full discussion held regarding risks benefits alternatives and complications related surgical intervention. Conservative care options reviewed. All questions answered.  The patient was identified in the preoperative holding area and the operative extremity was marked. The patient was brought to the operating room and transferred to operating table in a supine position. Satisfactory general anesthesia was induced by anesthesiology.    Standard anterolateral, anteromedial arthroscopy portals were obtained. The anteromedial portal was obtained with a spinal needle for localization under direct visualization with subsequent diagnostic findings.   Anteromedial and anterolateral chambers: moderate synovitis. The synovitis was debrided with a 4.5 mm full radius shaver through both the anteromedial and lateral portals.  Following the synovectomy of the medial and lateral compartments we were able to visualize much better the structures of the  knee.  Suprapatellar pouch and gutters: mild synovitis or debris. Patella chondral surface: Grade 0 Trochlear chondral surface: Grade 0 Patellofemoral tracking: Midline level Medial meniscus: Intact.  Medial femoral condyle flexion bearing surface: Grade 0 Medial femoral condyle extension bearing surface: Grade 0 Medial tibial plateau: Grade 1 Anterior cruciate ligament:stable Posterior cruciate ligament:stable Lateral meniscus: Horizontal tear of the posterior horn of the white zone.  Also short radial tears of the mid body in the white zone..   Lateral femoral condyle flexion bearing surface: Grade 1 Lateral femoral condyle extension bearing surface: Grade 2 Lateral tibial plateau: Grade 1  Partial lateral meniscectomy was carried out with combination of meniscal biter and motorized shaver.  All unstable tissue was debrided.  After completion of synovectomy, diagnostic exam, and debridements as described, all compartments were checked and no residual debris remained. Hemostasis was achieved with the cautery wand. The portals were approximated with buried monocryl. All excess fluid was expressed from the joint.  Xeroform sterile gauze dressings were applied followed by Ace bandage and ice pack.   DISPOSITION: The patient was awakened from general anesthetic, extubated, taken to the recovery room in medically stable condition, no apparent complications. The patient may be weightbearing as tolerated to the operative lower extremity.  Range of motion of right knee as tolerated.

## 2020-07-30 NOTE — H&P (Signed)
ORTHOPAEDIC H and P  REQUESTING PHYSICIAN: Yolonda Kida, MD  PCP:  Fleet Contras, MD  Chief Complaint: Right knee pain  HPI: Zoe Hernandez is a 40 y.o. female who complains of recalcitrant right knee pain following an injury.  She has been struggling for significant improvement with conservative treatments.  She is here today for right knee arthroscopy.  No new complaints.  Past Medical History:  Diagnosis Date  . Anemia    none recent  . Carpal tunnel syndrome of left wrist   . Dislocation of right knee with lateral meniscus tear   . GERD (gastroesophageal reflux disease)    Past Surgical History:  Procedure Laterality Date  . CARPAL TUNNEL RELEASE Right 08/11/2016   Procedure: RIGHT CARPAL TUNNEL RELEASE;  Surgeon: Betha Loa, MD;  Location: [facility];  Service: Orthopedics;  Laterality: Right;  . CHOLECYSTECTOMY  not sure   laparoscopic  . TONSILLECTOMY  age 35  . WISDOM TOOTH EXTRACTION  10/11/2012   Social History   Socioeconomic History  . Marital status: Married    Spouse name: Not on file  . Number of children: Not on file  . Years of education: Not on file  . Highest education level: Not on file  Occupational History  . Not on file  Tobacco Use  . Smoking status: Current Every Day Smoker    Years: 15.00    Types: Cigars  . Smokeless tobacco: Never Used  . Tobacco comment: 1 -2 cigar/day  Vaping Use  . Vaping Use: Never used  Substance and Sexual Activity  . Alcohol use: Yes    Comment: socially  . Drug use: No  . Sexual activity: Yes    Partners: Male    Birth control/protection: Pill  Other Topics Concern  . Not on file  Social History Narrative  . Not on file   Social Determinants of Health   Financial Resource Strain: Not on file  Food Insecurity: Not on file  Transportation Needs: Not on file  Physical Activity: Not on file  Stress: Not on file  Social Connections: Not on file   Family History  Problem  Relation Age of Onset  . Hypertension Mother   . Heart disease Mother   . Breast cancer Mother 22  . Hypertension Father   . Diabetes Father   . Lung cancer Maternal Grandmother    No Known Allergies Prior to Admission medications   Medication Sig Start Date End Date Taking? Authorizing Provider  aspirin 325 MG tablet Take 325 mg by mouth daily.   Yes [provider]  calcium carbonate (TUMS - DOSED IN MG ELEMENTAL CALCIUM) 500 MG chewable tablet Chew 1 tablet by mouth as needed for indigestion or heartburn.   Yes [provider]  ibuprofen (ADVIL) 800 MG tablet TK 1 TABLET PO TID PRN 11/19/19  Yes Brock Bad, MD  Multiple Vitamin (MULTIVITAMIN ADULT PO) Take by mouth.   Yes [provider]  norgestimate-ethinyl estradiol (ORTHO-CYCLEN) 0.25-35 MG-MCG tablet Take 1 tablet by mouth daily. Patient taking differently: Take 1 tablet by mouth every evening. 11/19/19  Yes Brock Bad, MD  Vitamin D, Ergocalciferol, (DRISDOL) 1.25 MG (50000 UNIT) CAPS capsule Take 50,000 Units by mouth once a week. monday 11/13/19  Yes [provider]  albuterol (PROVENTIL HFA;VENTOLIN HFA) 108 (90 Base) MCG/ACT inhaler Inhale into the lungs.    [provider]  phentermine 37.5 MG capsule Take by mouth. Patient not taking: No  sig reported    [provider]   No results found.  Positive ROS: All other systems have been reviewed and were otherwise negative with the exception of those mentioned in the HPI and as above.  Physical Exam: General: Alert, no acute distress Cardiovascular: No pedal edema Respiratory: No cyanosis, no use of accessory musculature GI: No organomegaly, abdomen is soft and non-tender Skin: No lesions in the area of chief complaint Neurologic: Sensation intact distally Psychiatric: Patient is competent for consent with normal mood and affect Lymphatic: No axillary or cervical lymphadenopathy  MUSCULOSKELETAL:  Right lower  extremity is warm and well-perfused with no obvious deformity or open wounds.  Neurovascular intact.  Assessment: Right knee lateral meniscus tear, acute.  Plan: -Plans to proceed today with right knee arthroscopy with possible meniscectomies of the medial and lateral side.  Debridements as indicated.  We again reviewed the risk and benefits of this procedure in detail.  All questions solicited and answered to her satisfaction.  Informed consent given.  -Plan for discharge home postoperatively from PACU.    Yolonda Kida, MD Cell [phone]    07/30/2020 1:30 PM

## 2020-07-30 NOTE — Discharge Instructions (Signed)
Post-operative patient instructions  Knee Arthroscopy   . Ice:  Place intermittent ice or cooler pack over your knee, 30 minutes on and 30 minutes off.  Continue this for the first 72 hours after surgery, then save ice for use after therapy sessions or on more active days.   . Weight:  You may bear weight on your leg as your symptoms allow. . DVT prevention: Perform ankle pumps as able throughout the day on the operative extremity.  Be mobile as possible with ambulation as able.  You should also take an 81 mg aspirin once per day x6 weeks. . Crutches:  Use crutches (or walker) to assist in walking until told to discontinue by your physical therapist or physician. This will help to reduce pain. . Strengthening:  Perform simple thigh squeezes (isometric quad contractions) and straight leg lifts as you are able (3 sets of 5 to 10 repetitions, 3 times a day).  For the leg lifts, have someone support under your ankle in the beginning until you have increased strength enough to do this on your own.  To help get started on thigh squeezes, place a pillow under your knee and push down on the pillow with back of knee (sometimes easier to do than with your leg fully straight). . Motion:  Perform gentle knee motion as tolerated - this is gentle bending and straightening of the knee. Seated heel slides: you can start by sitting in a chair, remove your brace, and gently slide your heel back on the floor - allowing your knee to bend. Have someone help you straighten your knee (or use your other leg/foot hooked under your ankle.  . Dressing:  Perform 1st dressing change at 3 days postoperative. A moderate amount of blood tinged drainage is to be expected.  So if you bleed through the dressing on the first or second day or if you have fevers, it is fine to change the dressing/check the wounds early and redress wound. Elevate your leg.  If it bleeds through again, or if the incisions are leaking frank blood, please call the  office. May change dressing every 1-2 days thereafter to help watch wounds. Can purchase Tegderm (or 25M Nexcare) water resistant dressings at local pharmacy / [facility]. . Shower:  Light shower is ok after 3 days.  Please take shower, NO bath. Recover with gauze and ace wrap to help keep wounds protected.   . Pain medication:  A narcotic pain medication has been prescribed.  Take as directed.  Typically you need narcotic pain medication more regularly during the first 3 to 5 days after surgery.  Decrease your use of the medication as the pain improves.  Narcotics can sometimes cause constipation, even after a few doses.  If you have problems with constipation, you can take an over the counter stool softener or light laxative.  If you have persistent problems, please notify your physician's office. Marland Kitchen Physical therapy: Additional activity guidelines to be provided by your physician or physical therapist at follow-up visits.  . Driving: Do not recommend driving x 1-2 weeks post surgical, especially if surgery performed on right side. Should not drive while taking narcotic pain medications. It typically takes at least 2 weeks to restore sufficient neuromuscular function for normal reaction times for driving safety.  . Call [phone] for questions or problems. Evenings you will be forwarded to the hospital operator.  Ask for the orthopaedic physician on call. Please call if you experience:    o Redness, foul  smelling, or persistent drainage from the surgical site  o worsening knee pain and swelling not responsive to medication  o any calf pain and or swelling of the lower leg  o temperatures greater than 101.5 F o other questions or concerns   Thank you for allowing [place] to be a part of your care.  ANKLE: Pumps    Point toes down, then up.   Copyright  VHI. All rights reserved.    Post Anesthesia Home Care Instructions  Activity: Get plenty of rest for the remainder of the day. A responsible  individual must stay with you for 24 hours following the procedure.  For the next 24 hours, DO NOT: -Drive a car -Advertising copywriter -Drink alcoholic beverages -Take any medication unless instructed by your physician -Make any legal decisions or sign important papers.  Meals: Start with liquid foods such as gelatin or soup. Progress to regular foods as tolerated. Avoid greasy, spicy, heavy foods. If nausea and/or vomiting occur, drink only clear liquids until the nausea and/or vomiting subsides. Call your physician if vomiting continues.  Special Instructions/Symptoms: Your throat may feel dry or sore from the anesthesia or the breathing tube placed in your throat during surgery. If this causes discomfort, gargle with warm salt water. The discomfort should disappear within 24 hours.  If you had a scopolamine patch placed behind your ear for the management of post- operative nausea and/or vomiting:  1. The medication in the patch is effective for 72 hours, after which it should be removed.  Wrap patch in a tissue and discard in the trash. Wash hands thoroughly with soap and water. 2. You may remove the patch earlier than 72 hours if you experience unpleasant side effects which may include dry mouth, dizziness or visual disturbances. 3. Avoid touching the patch. Wash your hands with soap and water after contact with the patch.    No ibuprofen, Advil, Aleve, Motrin, or naproxen until after 8:15 pm today if needed.

## 2020-08-03 ENCOUNTER — Encounter (HOSPITAL_BASED_OUTPATIENT_CLINIC_OR_DEPARTMENT_OTHER): Payer: Self-pay | Admitting: Orthopedic Surgery

## 2020-08-13 DIAGNOSIS — M25561 Pain in right knee: Secondary | ICD-10-CM | POA: Diagnosis not present

## 2020-08-25 DIAGNOSIS — M25561 Pain in right knee: Secondary | ICD-10-CM | POA: Diagnosis not present

## 2020-08-27 DIAGNOSIS — M25561 Pain in right knee: Secondary | ICD-10-CM | POA: Diagnosis not present

## 2020-09-03 DIAGNOSIS — M25561 Pain in right knee: Secondary | ICD-10-CM | POA: Diagnosis not present

## 2020-09-10 DIAGNOSIS — M25561 Pain in right knee: Secondary | ICD-10-CM | POA: Diagnosis not present

## 2020-10-09 DIAGNOSIS — M25561 Pain in right knee: Secondary | ICD-10-CM | POA: Diagnosis not present

## 2020-10-13 DIAGNOSIS — Z0000 Encounter for general adult medical examination without abnormal findings: Secondary | ICD-10-CM | POA: Diagnosis not present

## 2020-10-13 DIAGNOSIS — J42 Unspecified chronic bronchitis: Secondary | ICD-10-CM | POA: Diagnosis not present

## 2020-10-13 DIAGNOSIS — E668 Other obesity: Secondary | ICD-10-CM | POA: Diagnosis not present

## 2020-10-13 DIAGNOSIS — Z6835 Body mass index (BMI) 35.0-35.9, adult: Secondary | ICD-10-CM | POA: Diagnosis not present

## 2020-10-13 DIAGNOSIS — Z131 Encounter for screening for diabetes mellitus: Secondary | ICD-10-CM | POA: Diagnosis not present

## 2020-10-13 DIAGNOSIS — Z716 Tobacco abuse counseling: Secondary | ICD-10-CM | POA: Diagnosis not present

## 2020-10-13 DIAGNOSIS — E559 Vitamin D deficiency, unspecified: Secondary | ICD-10-CM | POA: Diagnosis not present

## 2020-10-13 DIAGNOSIS — E7849 Other hyperlipidemia: Secondary | ICD-10-CM | POA: Diagnosis not present

## 2020-11-24 DIAGNOSIS — E7849 Other hyperlipidemia: Secondary | ICD-10-CM | POA: Diagnosis not present

## 2020-11-24 DIAGNOSIS — E559 Vitamin D deficiency, unspecified: Secondary | ICD-10-CM | POA: Diagnosis not present

## 2020-11-24 DIAGNOSIS — E668 Other obesity: Secondary | ICD-10-CM | POA: Diagnosis not present

## 2020-11-24 DIAGNOSIS — J42 Unspecified chronic bronchitis: Secondary | ICD-10-CM | POA: Diagnosis not present

## 2020-11-24 DIAGNOSIS — Z6835 Body mass index (BMI) 35.0-35.9, adult: Secondary | ICD-10-CM | POA: Diagnosis not present

## 2020-11-24 DIAGNOSIS — S83211D Bucket-handle tear of medial meniscus, current injury, right knee, subsequent encounter: Secondary | ICD-10-CM | POA: Diagnosis not present

## 2021-01-04 DIAGNOSIS — Z6835 Body mass index (BMI) 35.0-35.9, adult: Secondary | ICD-10-CM | POA: Diagnosis not present

## 2021-01-04 DIAGNOSIS — E7849 Other hyperlipidemia: Secondary | ICD-10-CM | POA: Diagnosis not present

## 2021-01-04 DIAGNOSIS — J302 Other seasonal allergic rhinitis: Secondary | ICD-10-CM | POA: Diagnosis not present

## 2021-01-04 DIAGNOSIS — S83211D Bucket-handle tear of medial meniscus, current injury, right knee, subsequent encounter: Secondary | ICD-10-CM | POA: Diagnosis not present

## 2021-01-04 DIAGNOSIS — E668 Other obesity: Secondary | ICD-10-CM | POA: Diagnosis not present

## 2021-02-25 ENCOUNTER — Other Ambulatory Visit (HOSPITAL_COMMUNITY)
Admission: RE | Admit: 2021-02-25 | Discharge: 2021-02-25 | Disposition: A | Discharge status: Home / Self Care | Payer: BC Managed Care – PPO | Source: Ambulatory Visit | Attending: Obstetrics | Admitting: Obstetrics

## 2021-02-25 ENCOUNTER — Encounter: Payer: Self-pay | Admitting: Obstetrics

## 2021-02-25 ENCOUNTER — Other Ambulatory Visit: Payer: Self-pay

## 2021-02-25 ENCOUNTER — Ambulatory Visit (INDEPENDENT_AMBULATORY_CARE_PROVIDER_SITE_OTHER): Payer: BC Managed Care – PPO | Admitting: Obstetrics

## 2021-02-25 VITALS — BP 134/93 | HR 90 | Ht 65.0 in | Wt 214.6 lb

## 2021-02-25 DIAGNOSIS — Z01419 Encounter for gynecological examination (general) (routine) without abnormal findings: Secondary | ICD-10-CM | POA: Diagnosis not present

## 2021-02-25 DIAGNOSIS — Z3041 Encounter for surveillance of contraceptive pills: Secondary | ICD-10-CM | POA: Diagnosis not present

## 2021-02-25 DIAGNOSIS — N898 Other specified noninflammatory disorders of vagina: Secondary | ICD-10-CM

## 2021-02-25 DIAGNOSIS — F17200 Nicotine dependence, unspecified, uncomplicated: Secondary | ICD-10-CM

## 2021-02-25 DIAGNOSIS — E669 Obesity, unspecified: Secondary | ICD-10-CM

## 2021-02-25 DIAGNOSIS — N944 Primary dysmenorrhea: Secondary | ICD-10-CM | POA: Diagnosis not present

## 2021-02-25 MED ORDER — NORGESTIMATE-ETH ESTRADIOL 0.25-35 MG-MCG PO TABS: 1.0000 | ORAL_TABLET | Freq: Every evening | ORAL | 11 refills | Status: AC

## 2021-02-25 MED ORDER — IBUPROFEN 800 MG PO TABS: ORAL_TABLET | ORAL | 5 refills | Status: AC

## 2021-02-25 NOTE — Progress Notes (Signed)
Subjective:        Zoe Hernandez is a 40 y.o. female here for a routine exam.  Current complaints: Vaginal discharge.    Personal health questionnaire:  Is patient [name], have a family history of breast and/or ovarian cancer: no Is there a family history of uterine cancer diagnosed at age < 37, gastrointestinal cancer, urinary tract cancer, family member who is a Personnel officer syndrome-associated carrier: no Is the patient overweight and hypertensive, family history of diabetes, personal history of gestational diabetes, preeclampsia or PCOS: no Is patient over 74, have PCOS,  family history of premature CHD under age 32, diabetes, smoke, have hypertension or peripheral artery disease:  no At any time, has a partner hit, kicked or otherwise hurt or frightened you?: no Over the past 2 weeks, have you felt down, depressed or hopeless?: no Over the past 2 weeks, have you felt little interest or pleasure in doing things?:no   Gynecologic History No LMP recorded. Contraception: OCP (estrogen/progesterone) Last Pap: 11-19-2019. Results were: normal Last mammogram: 07-01-2020. Results were: normal  Obstetric History OB History  Gravida Para Term Preterm AB Living  3 1 1   2 1   SAB IAB Ectopic Multiple Live Births    2     1    # Outcome Date GA Lbr Len/2nd Weight Sex Delivery Anes PTL Lv  3 Term 12/19/00     Vag-Spont   LIV  2 IAB           1 IAB             Past Medical History:  Diagnosis Date   Anemia    none recent   Carpal tunnel syndrome of left wrist    Dislocation of right knee with lateral meniscus tear    GERD (gastroesophageal reflux disease)     Past Surgical History:  Procedure Laterality Date   CARPAL TUNNEL RELEASE Right 08/11/2016   Procedure: RIGHT CARPAL TUNNEL RELEASE;  Surgeon: 08/13/2016, MD;  Location: [facility];  Service: Orthopedics;  Laterality: Right;   CHOLECYSTECTOMY  not sure   laparoscopic   KNEE ARTHROSCOPY WITH LATERAL  MENISECTOMY Right 07/30/2020   Procedure: KNEE ARTHROSCOPY WITH PARTIAL LATERAL MENISECTOMY;  Surgeon: 08/01/2020, MD;  Location: [facility];  Service: Orthopedics;  Laterality: Right;  102 MINS   TONSILLECTOMY  age 28   WISDOM TOOTH EXTRACTION  10/11/2012     Current Outpatient Medications:    albuterol (PROVENTIL HFA;VENTOLIN HFA) 108 (90 Base) MCG/ACT inhaler, Inhale into the lungs., Disp: , Rfl:    atorvastatin (LIPITOR) 20 MG tablet, Take 20 mg by mouth at bedtime., Disp: , Rfl:    calcium carbonate (TUMS - DOSED IN MG ELEMENTAL CALCIUM) 500 MG chewable tablet, Chew 1 tablet by mouth as needed for indigestion or heartburn., Disp: , Rfl:    clotrimazole-betamethasone (LOTRISONE) cream, Apply topically 2 (two) times daily as needed., Disp: , Rfl:    ibuprofen (ADVIL) 800 MG tablet, TK 1 TABLET PO TID PRN, Disp: 30 tablet, Rfl: 5   Multiple Vitamin (MULTIVITAMIN ADULT PO), Take by mouth., Disp: , Rfl:    norgestimate-ethinyl estradiol (ORTHO-CYCLEN) 0.25-35 MG-MCG tablet, Take 1 tablet by mouth daily. (Patient taking differently: Take 1 tablet by mouth every evening.), Disp: 28 tablet, Rfl: 11   phentermine 37.5 MG capsule, Take by mouth., Disp: , Rfl:    Vitamin D, Ergocalciferol, (DRISDOL) 1.25 MG (50000 UNIT) CAPS capsule, Take 50,000 Units by  mouth once a week. monday, Disp: , Rfl:    aspirin 325 MG tablet, Take 325 mg by mouth daily. (Patient not taking: Reported on 02/25/2021), Disp: , Rfl:  No Known Allergies  Social History   Tobacco Use   Smoking status: Every Day    Types: Cigars   Smokeless tobacco: Never   Tobacco comments:    1 -2 cigar/day  Substance Use Topics   Alcohol use: Yes    Comment: socially    Family History  Problem Relation Age of Onset   Hypertension Mother    Heart disease Mother    Breast cancer Mother 48   Hypertension Father    Diabetes Father    Lung cancer Maternal Grandmother       Review of  Systems  Constitutional: negative for fatigue and weight loss Respiratory: negative for cough and wheezing Cardiovascular: negative for chest pain, fatigue and palpitations Gastrointestinal: negative for abdominal pain and change in bowel habits Musculoskeletal:negative for myalgias Neurological: negative for gait problems and tremors Behavioral/Psych: negative for abusive relationship, depression Endocrine: negative for temperature intolerance    Genitourinary: positive for vaginal discharge.  negative for abnormal menstrual periods, genital lesions, hot flashes, sexual problems  Integument/breast: negative for breast lump, breast tenderness, nipple discharge and skin lesion(s)    Objective:       BP (!) 134/93   Pulse 90   Ht 5\' 5"  (1.651 m)   Wt 214 lb 9.6 oz (97.3 kg)   BMI 35.71 kg/m  General:   Alert and no distress  Skin:   no rash or abnormalities  Lungs:   clear to auscultation bilaterally  Heart:   regular rate and rhythm, S1, S2 normal, no murmur, click, rub or gallop  Breasts:   normal without suspicious masses, skin or nipple changes or axillary nodes  Abdomen:  normal findings: no organomegaly, soft, non-tender and no hernia  Pelvis:  External genitalia: normal general appearance Urinary system: urethral meatus normal and bladder without fullness, nontender Vaginal: normal without tenderness, induration or masses Cervix: normal appearance Adnexa: normal bimanual exam Uterus: anteverted and non-tender, normal size   Lab Review Urine pregnancy test Labs reviewed yes Radiologic studies reviewed yes  I have spent a total of 20 minutes of face-to-face time, excluding clinical staff time, reviewing notes and preparing to see patient, ordering tests and/or medications, and counseling the patient.   Assessment:    1. Encounter for gynecological examination with Papanicolaou smear of cervix Rx: - Cytology - PAP( [place])  2. Vaginal discharge Rx: -  Cervicovaginal ancillary only( [place])  3. Primary dysmenorrhea Rx: - ibuprofen (ADVIL) 800 MG tablet; TK 1 TABLET PO TID PRN  Dispense: 30 tablet; Refill: 5  4. Encounter for surveillance of contraceptive pills Rx: - norgestimate-ethinyl estradiol (ORTHO-CYCLEN) 0.25-35 MG-MCG tablet; Take 1 tablet by mouth every evening.  Dispense: 28 tablet; Refill: 11  5. Obesity (BMI 30.0-34.9) - weight reduction with the aid of dietary changes, exercise and behavioral modification recommended  6. Tobacco dependence - cessation with the aid of medication and behavioral modification recolmmended     Plan:    Education reviewed: calcium supplements, depression evaluation, low fat, low cholesterol diet, safe sex/STD prevention, self breast exams, smoking cessation, and weight bearing exercise. Contraception: OCP (estrogen/progesterone). Follow up in: 1 year.      08-20-1992, MD 02/25/2021 2:57 PM

## 2021-02-25 NOTE — Progress Notes (Signed)
Patient presents for AEX. Patient has no concerns today. She declines flu vaccine.  Last pap: 11/19/19 Last MM: 05/2020 Normal

## 2021-03-01 LAB — CERVICOVAGINAL ANCILLARY ONLY
Bacterial Vaginitis (gardnerella): POSITIVE — AB
Candida Glabrata: NEGATIVE
Candida Vaginitis: NEGATIVE
Chlamydia: NEGATIVE
Comment: NEGATIVE
Comment: NEGATIVE
Comment: NEGATIVE
Comment: NEGATIVE
Comment: NEGATIVE
Comment: NORMAL
Neisseria Gonorrhea: NEGATIVE
Trichomonas: NEGATIVE

## 2021-03-03 LAB — CYTOLOGY - PAP
Comment: NEGATIVE
Diagnosis: NEGATIVE
High risk HPV: NEGATIVE

## 2021-03-05 ENCOUNTER — Other Ambulatory Visit: Payer: Self-pay | Admitting: Obstetrics

## 2021-03-05 DIAGNOSIS — B9689 Other specified bacterial agents as the cause of diseases classified elsewhere: Secondary | ICD-10-CM

## 2021-03-05 MED ORDER — METRONIDAZOLE 500 MG PO TABS: 500.0000 mg | ORAL_TABLET | Freq: Two times a day (BID) | ORAL | 2 refills | Status: DC

## 2021-03-16 ENCOUNTER — Ambulatory Visit: Payer: BC Managed Care – PPO | Admitting: Obstetrics

## 2021-05-25 ENCOUNTER — Ambulatory Visit
Admission: EM | Admit: 2021-05-25 | Discharge: 2021-05-25 | Discharge status: Absent without leave | Payer: BC Managed Care – PPO

## 2021-06-23 ENCOUNTER — Other Ambulatory Visit: Payer: Self-pay | Admitting: Internal Medicine

## 2021-06-23 DIAGNOSIS — Z09 Encounter for follow-up examination after completed treatment for conditions other than malignant neoplasm: Secondary | ICD-10-CM

## 2021-07-20 ENCOUNTER — Ambulatory Visit
Admission: RE | Admit: 2021-07-20 | Discharge: 2021-07-20 | Disposition: A | Discharge status: Home / Self Care | Payer: BC Managed Care – PPO | Source: Ambulatory Visit | Attending: Internal Medicine | Admitting: Internal Medicine

## 2021-07-20 DIAGNOSIS — Z09 Encounter for follow-up examination after completed treatment for conditions other than malignant neoplasm: Secondary | ICD-10-CM

## 2021-07-20 DIAGNOSIS — R922 Inconclusive mammogram: Secondary | ICD-10-CM | POA: Diagnosis not present

## 2021-07-20 DIAGNOSIS — N6489 Other specified disorders of breast: Secondary | ICD-10-CM | POA: Diagnosis not present

## 2022-03-08 ENCOUNTER — Other Ambulatory Visit: Payer: Self-pay | Admitting: Internal Medicine

## 2022-03-08 DIAGNOSIS — Z716 Tobacco abuse counseling: Secondary | ICD-10-CM | POA: Diagnosis not present

## 2022-03-08 DIAGNOSIS — Z131 Encounter for screening for diabetes mellitus: Secondary | ICD-10-CM | POA: Diagnosis not present

## 2022-03-08 DIAGNOSIS — F17210 Nicotine dependence, cigarettes, uncomplicated: Secondary | ICD-10-CM | POA: Diagnosis not present

## 2022-03-08 DIAGNOSIS — E7849 Other hyperlipidemia: Secondary | ICD-10-CM | POA: Diagnosis not present

## 2022-03-08 DIAGNOSIS — E559 Vitamin D deficiency, unspecified: Secondary | ICD-10-CM | POA: Diagnosis not present

## 2022-03-08 DIAGNOSIS — E668 Other obesity: Secondary | ICD-10-CM | POA: Diagnosis not present

## 2022-03-08 DIAGNOSIS — Z124 Encounter for screening for malignant neoplasm of cervix: Secondary | ICD-10-CM | POA: Diagnosis not present

## 2022-03-08 DIAGNOSIS — Z0000 Encounter for general adult medical examination without abnormal findings: Secondary | ICD-10-CM | POA: Diagnosis not present

## 2022-03-08 DIAGNOSIS — Z6836 Body mass index (BMI) 36.0-36.9, adult: Secondary | ICD-10-CM | POA: Diagnosis not present

## 2022-03-08 DIAGNOSIS — J209 Acute bronchitis, unspecified: Secondary | ICD-10-CM | POA: Diagnosis not present

## 2022-03-09 LAB — CBC
HCT: 36.2 % (ref 35.0–45.0)
Hemoglobin: 11.7 g/dL (ref 11.7–15.5)
MCH: 27 pg (ref 27.0–33.0)
MCHC: 32.3 g/dL (ref 32.0–36.0)
MCV: 83.6 fL (ref 80.0–100.0)
MPV: 12 fL (ref 7.5–12.5)
Platelets: 242 10*3/uL (ref 140–400)
RBC: 4.33 10*6/uL (ref 3.80–5.10)
RDW: 12.7 % (ref 11.0–15.0)
WBC: 6.6 10*3/uL (ref 3.8–10.8)

## 2022-03-09 LAB — COMPLETE METABOLIC PANEL WITH GFR
AG Ratio: 1.5 (calc) (ref 1.0–2.5)
ALT: 14 U/L (ref 6–29)
AST: 18 U/L (ref 10–30)
Albumin: 4.4 g/dL (ref 3.6–5.1)
Alkaline phosphatase (APISO): 49 U/L (ref 31–125)
BUN: 12 mg/dL (ref 7–25)
CO2: 23 mmol/L (ref 20–32)
Calcium: 9.2 mg/dL (ref 8.6–10.2)
Chloride: 104 mmol/L (ref 98–110)
Creat: 0.69 mg/dL (ref 0.50–0.99)
Globulin: 3 g/dL (calc) (ref 1.9–3.7)
Glucose, Bld: 80 mg/dL (ref 65–99)
Potassium: 4.1 mmol/L (ref 3.5–5.3)
Sodium: 137 mmol/L (ref 135–146)
Total Bilirubin: 0.4 mg/dL (ref 0.2–1.2)
Total Protein: 7.4 g/dL (ref 6.1–8.1)
eGFR: 112 mL/min/{1.73_m2} (ref >=60)

## 2022-03-09 LAB — VITAMIN D 25 HYDROXY (VIT D DEFICIENCY, FRACTURES): Vit D, 25-Hydroxy: 31 ng/mL (ref 30–100)

## 2022-03-09 LAB — LIPID PANEL
Cholesterol: 198 mg/dL (ref <200)
HDL: 42 mg/dL — ABNORMAL LOW (ref >=50)
LDL Cholesterol (Calc): 139 mg/dL (calc) — ABNORMAL HIGH
Non-HDL Cholesterol (Calc): 156 mg/dL (calc) — ABNORMAL HIGH (ref <130)
Total CHOL/HDL Ratio: 4.7 (calc) (ref <5.0)
Triglycerides: 71 mg/dL (ref <150)

## 2022-03-28 ENCOUNTER — Telehealth: Payer: Self-pay

## 2022-03-28 NOTE — Telephone Encounter (Signed)
NA

## 2022-04-21 DIAGNOSIS — E668 Other obesity: Secondary | ICD-10-CM | POA: Diagnosis not present

## 2022-04-21 DIAGNOSIS — E7849 Other hyperlipidemia: Secondary | ICD-10-CM | POA: Diagnosis not present

## 2022-04-21 DIAGNOSIS — Z7189 Other specified counseling: Secondary | ICD-10-CM | POA: Diagnosis not present

## 2022-04-21 DIAGNOSIS — J42 Unspecified chronic bronchitis: Secondary | ICD-10-CM | POA: Diagnosis not present

## 2022-04-21 DIAGNOSIS — J302 Other seasonal allergic rhinitis: Secondary | ICD-10-CM | POA: Diagnosis not present

## 2022-04-21 DIAGNOSIS — Z6836 Body mass index (BMI) 36.0-36.9, adult: Secondary | ICD-10-CM | POA: Diagnosis not present

## 2022-05-17 ENCOUNTER — Other Ambulatory Visit (HOSPITAL_COMMUNITY)
Admission: RE | Admit: 2022-05-17 | Discharge: 2022-05-17 | Disposition: A | Discharge status: Home / Self Care | Payer: BC Managed Care – PPO | Source: Ambulatory Visit | Attending: Obstetrics | Admitting: Obstetrics

## 2022-05-17 ENCOUNTER — Encounter: Payer: Self-pay | Admitting: Obstetrics

## 2022-05-17 ENCOUNTER — Ambulatory Visit (INDEPENDENT_AMBULATORY_CARE_PROVIDER_SITE_OTHER): Payer: BC Managed Care – PPO | Admitting: Obstetrics

## 2022-05-17 VITALS — BP 131/88 | HR 82 | Ht 65.0 in | Wt 219.3 lb

## 2022-05-17 DIAGNOSIS — Z3009 Encounter for other general counseling and advice on contraception: Secondary | ICD-10-CM

## 2022-05-17 DIAGNOSIS — Z01419 Encounter for gynecological examination (general) (routine) without abnormal findings: Secondary | ICD-10-CM | POA: Insufficient documentation

## 2022-05-17 DIAGNOSIS — N898 Other specified noninflammatory disorders of vagina: Secondary | ICD-10-CM

## 2022-05-17 DIAGNOSIS — E669 Obesity, unspecified: Secondary | ICD-10-CM

## 2022-05-17 DIAGNOSIS — F17200 Nicotine dependence, unspecified, uncomplicated: Secondary | ICD-10-CM

## 2022-05-17 NOTE — Progress Notes (Signed)
Subjective:        Zoe Hernandez is a 42 y.o. female here for a routine exam.  Current complaints: Vaginal discharge.    Personal health questionnaire:  Is patient [name], have a family history of breast and/or ovarian cancer: yes Is there a family history of uterine cancer diagnosed at age < 76, gastrointestinal cancer, urinary tract cancer, family member who is a Field seismologist syndrome-associated carrier: no Is the patient overweight and hypertensive, family history of diabetes, personal history of gestational diabetes, preeclampsia or PCOS: no Is patient over 66, have PCOS,  family history of premature CHD under age 61, diabetes, smoke, have hypertension or peripheral artery disease:  no At any time, has a partner hit, kicked or otherwise hurt or frightened you?: no Over the past 2 weeks, have you felt down, depressed or hopeless?: no Over the past 2 weeks, have you felt little interest or pleasure in doing things?:no   Gynecologic History Patient's last menstrual period was 05/05/2022. Contraception: OCP (estrogen/progesterone) Last Pap: 2022. Results were: normal Last mammogram: 2023. Results were: normal  Obstetric History OB History  Gravida Para Term Preterm AB Living  3 1 1   2 1   SAB IAB Ectopic Multiple Live Births    2     1    # Outcome Date GA Lbr Len/2nd Weight Sex Delivery Anes PTL Lv  3 Term 12/19/00     Vag-Spont   LIV  2 IAB           1 IAB             Past Medical History:  Diagnosis Date   Anemia    none recent   Carpal tunnel syndrome of left wrist    Dislocation of right knee with lateral meniscus tear    GERD (gastroesophageal reflux disease)     Past Surgical History:  Procedure Laterality Date   CARPAL TUNNEL RELEASE Right 08/11/2016   Procedure: RIGHT CARPAL TUNNEL RELEASE;  Surgeon: Leanora Cover, MD;  Location: [facility];  Service: Orthopedics;  Laterality: Right;   CHOLECYSTECTOMY  not sure   laparoscopic   KNEE  ARTHROSCOPY WITH LATERAL MENISECTOMY Right 07/30/2020   Procedure: KNEE ARTHROSCOPY WITH PARTIAL LATERAL MENISECTOMY;  Surgeon: Nicholes Stairs, MD;  Location[facility];  Service: Orthopedics;  Laterality: Right;  76 MINS   TONSILLECTOMY  age 33   WISDOM TOOTH EXTRACTION  10/11/2012     Current Outpatient Medications:    albuterol (PROVENTIL HFA;VENTOLIN HFA) 108 (90 Base) MCG/ACT inhaler, Inhale into the lungs., Disp: , Rfl:    atorvastatin (LIPITOR) 20 MG tablet, Take 20 mg by mouth at bedtime., Disp: , Rfl:    calcium carbonate (TUMS - DOSED IN MG ELEMENTAL CALCIUM) 500 MG chewable tablet, Chew 1 tablet by mouth as needed for indigestion or heartburn., Disp: , Rfl:    Multiple Vitamin (MULTIVITAMIN ADULT PO), Take by mouth., Disp: , Rfl:    phentermine 37.5 MG capsule, Take by mouth., Disp: , Rfl:    Vitamin D, Ergocalciferol, (DRISDOL) 1.25 MG (50000 UNIT) CAPS capsule, Take 50,000 Units by mouth once a week. monday, Disp: , Rfl:    aspirin 325 MG tablet, Take 325 mg by mouth daily. (Patient not taking: Reported on 02/25/2021), Disp: , Rfl:    clotrimazole-betamethasone (LOTRISONE) cream, Apply topically 2 (two) times daily as needed. (Patient not taking: Reported on 05/17/2022), Disp: , Rfl:    ibuprofen (ADVIL) 800 MG tablet,  TK 1 TABLET PO TID PRN (Patient not taking: Reported on 05/17/2022), Disp: 30 tablet, Rfl: 5   metroNIDAZOLE (FLAGYL) 500 MG tablet, Take 1 tablet (500 mg total) by mouth 2 (two) times daily. (Patient not taking: Reported on 05/17/2022), Disp: 14 tablet, Rfl: 2   norgestimate-ethinyl estradiol (ORTHO-CYCLEN) 0.25-35 MG-MCG tablet, Take 1 tablet by mouth every evening. (Patient not taking: Reported on 05/17/2022), Disp: 28 tablet, Rfl: 11 No Known Allergies  Social History   Tobacco Use   Smoking status: Every Day    Types: Cigars   Smokeless tobacco: Never   Tobacco comments:    1 -2 cigar/day  Substance Use Topics   Alcohol use: Yes     Comment: socially    Family History  Problem Relation Age of Onset   Hypertension Mother    Heart disease Mother    Breast cancer Mother 55   Hypertension Father    Diabetes Father    Lung cancer Maternal Grandmother       Review of Systems  Constitutional: negative for fatigue and weight loss Respiratory: negative for cough and wheezing Cardiovascular: negative for chest pain, fatigue and palpitations Gastrointestinal: negative for abdominal pain and change in bowel habits Musculoskeletal:negative for myalgias Neurological: negative for gait problems and tremors Behavioral/Psych: negative for abusive relationship, depression Endocrine: negative for temperature intolerance    Genitourinary: positive for vaginal discharge.  negative for abnormal menstrual periods, genital lesions, hot flashes, sexual problems  Integument/breast: negative for breast lump, breast tenderness, nipple discharge and skin lesion(s)    Objective:       BP 131/88   Pulse 82   Ht 5\' 5"  (1.651 m)   Wt 219 lb 4.8 oz (99.5 kg)   LMP 05/05/2022   BMI 36.49 kg/m  General:   Alert and no distress  Skin:   no rash or abnormalities  Lungs:   clear to auscultation bilaterally  Heart:   regular rate and rhythm, S1, S2 normal, no murmur, click, rub or gallop  Breasts:   normal without suspicious masses, skin or nipple changes or axillary nodes  Abdomen:  normal findings: no organomegaly, soft, non-tender and no hernia  Pelvis:  External genitalia: normal general appearance Urinary system: urethral meatus normal and bladder without fullness, nontender Vaginal: normal without tenderness, induration or masses Cervix: normal appearance Adnexa: normal bimanual exam Uterus: anteverted and non-tender, normal size   Lab Review Urine pregnancy test Labs reviewed yes Radiologic studies reviewed yes  I have spent a total of 20 minutes of face-to-face time, excluding clinical staff time, reviewing notes and  preparing to see patient, ordering tests and/or medications, and counseling the patient.   Assessment:    1. Encounter for gynecological examination with Papanicolaou smear of cervix Rx: - Cytology - PAP( [place])  2. Vaginal discharge Rx: - Cervicovaginal ancillary only( [place])  3. Obesity (BMI 30.0-34.9) - weight reduction recommended  4. Tobacco dependence - cessation recommended  5. Encounter for other general counseling and advice on contraception - declines contraception - condoms recommenced     Plan:    Education reviewed: calcium supplements, depression evaluation, low fat, low cholesterol diet, safe sex/STD prevention, self breast exams, smoking cessation, and weight bearing exercise. Contraception: none. Follow up in: 1 year.    Galya Dunnigan A. Jodi Mourning MD 05/17/2022

## 2022-05-17 NOTE — Progress Notes (Signed)
PT presents for AEX. Last PAP 02/2021. Requesting PAP today. Last mammogram 07/2021. Declines STD testing.

## 2022-05-18 LAB — CERVICOVAGINAL ANCILLARY ONLY
Bacterial Vaginitis (gardnerella): POSITIVE — AB
Candida Glabrata: NEGATIVE
Candida Vaginitis: NEGATIVE
Comment: NEGATIVE
Comment: NEGATIVE
Comment: NEGATIVE

## 2022-05-19 ENCOUNTER — Other Ambulatory Visit: Payer: Self-pay | Admitting: Obstetrics

## 2022-05-19 DIAGNOSIS — B9689 Other specified bacterial agents as the cause of diseases classified elsewhere: Secondary | ICD-10-CM

## 2022-05-19 MED ORDER — METRONIDAZOLE 500 MG PO TABS: 500.0000 mg | ORAL_TABLET | Freq: Two times a day (BID) | ORAL | 2 refills | Status: AC

## 2022-05-20 LAB — CYTOLOGY - PAP
Comment: NEGATIVE
Diagnosis: NEGATIVE
High risk HPV: NEGATIVE

## 2022-07-19 ENCOUNTER — Other Ambulatory Visit: Payer: Self-pay | Admitting: Internal Medicine

## 2022-07-19 DIAGNOSIS — Z1231 Encounter for screening mammogram for malignant neoplasm of breast: Secondary | ICD-10-CM

## 2022-07-21 DIAGNOSIS — Z6835 Body mass index (BMI) 35.0-35.9, adult: Secondary | ICD-10-CM | POA: Diagnosis not present

## 2022-07-21 DIAGNOSIS — E668 Other obesity: Secondary | ICD-10-CM | POA: Diagnosis not present

## 2022-07-21 DIAGNOSIS — J42 Unspecified chronic bronchitis: Secondary | ICD-10-CM | POA: Diagnosis not present

## 2022-07-21 DIAGNOSIS — J302 Other seasonal allergic rhinitis: Secondary | ICD-10-CM | POA: Diagnosis not present

## 2022-07-21 DIAGNOSIS — Z7189 Other specified counseling: Secondary | ICD-10-CM | POA: Diagnosis not present

## 2022-07-21 DIAGNOSIS — E559 Vitamin D deficiency, unspecified: Secondary | ICD-10-CM | POA: Diagnosis not present

## 2022-09-01 ENCOUNTER — Ambulatory Visit
Admission: RE | Admit: 2022-09-01 | Discharge: 2022-09-01 | Disposition: A | Discharge status: Home / Self Care | Payer: BC Managed Care – PPO | Source: Ambulatory Visit | Attending: Internal Medicine | Admitting: Internal Medicine

## 2022-09-01 DIAGNOSIS — Z1231 Encounter for screening mammogram for malignant neoplasm of breast: Secondary | ICD-10-CM | POA: Diagnosis not present

## 2022-10-22 IMAGING — MG DIGITAL DIAGNOSTIC BILAT W/ TOMO W/ CAD
8 series · 8 of 24 positions shown · non-contrast
Comparison: Previous exam(s).

CLINICAL DATA: 39-year-old female presenting for bilateral
mammogram and ultrasound follow-up of the left axilla.

EXAM:
DIGITAL DIAGNOSTIC BILATERAL MAMMOGRAM WITH TOMOSYNTHESIS AND CAD;
US AXILLARY LEFT
TECHNIQUE: Bilateral digital diagnostic mammography and breast tomosynthesis
was performed. The images were evaluated with computer-aided
detection.; Targeted ultrasound examination of the left axilla was
performed.

[L CC synth-2D]
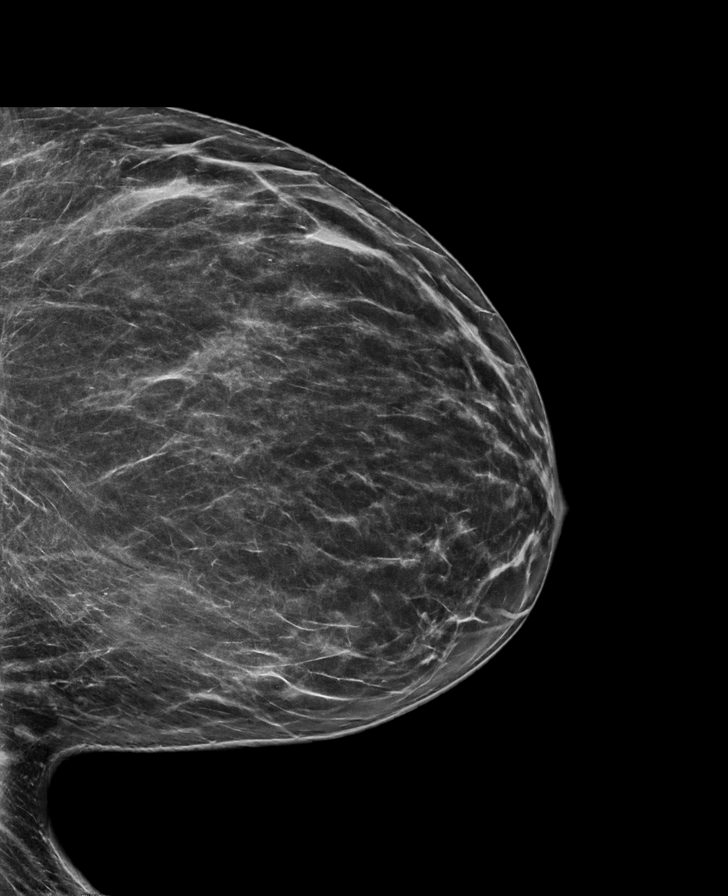

[L MLO synth-2D]
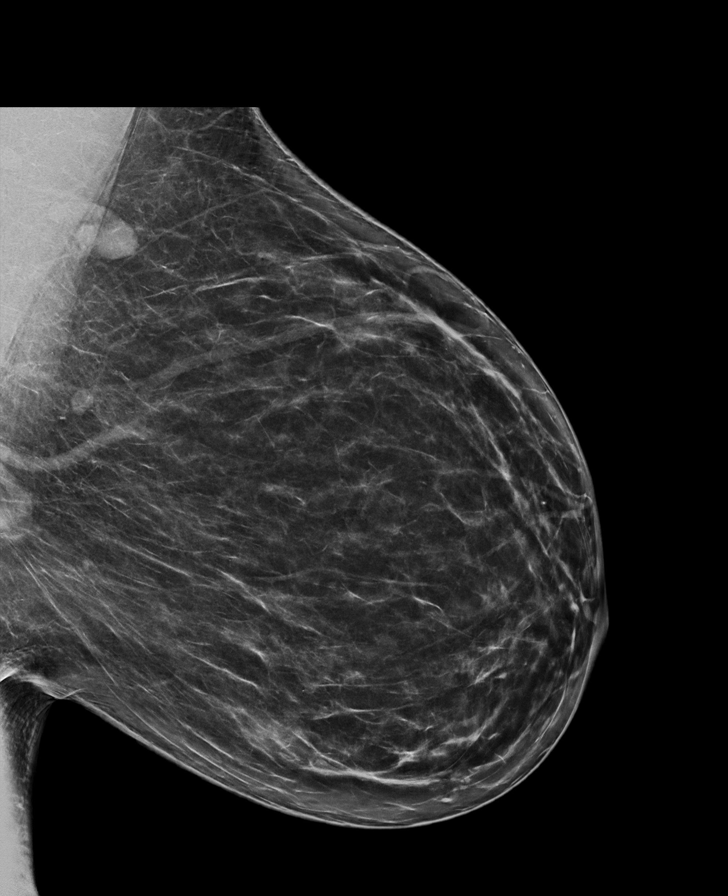

[R MLO synth-2D]
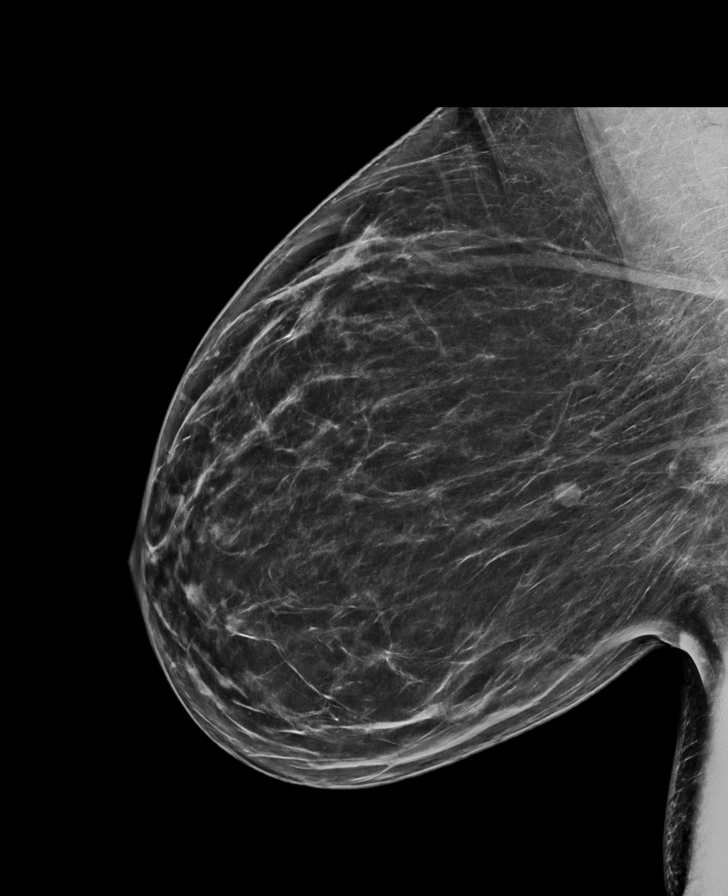

[R CC synth-2D]
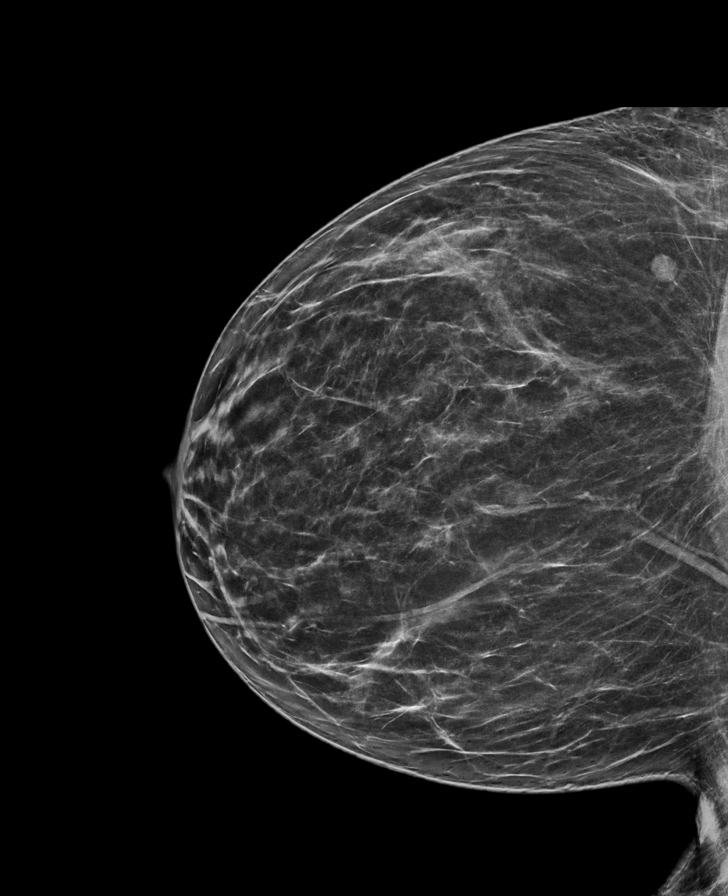

[R CC tomo · tomo slice 35/70.0]
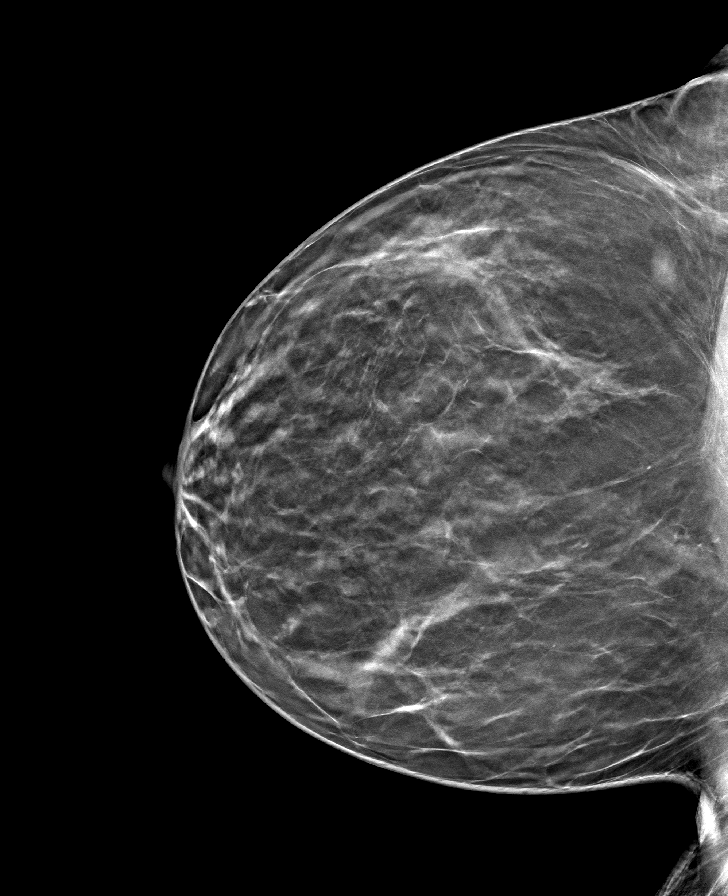

[L CC tomo · tomo slice 37/72.0]
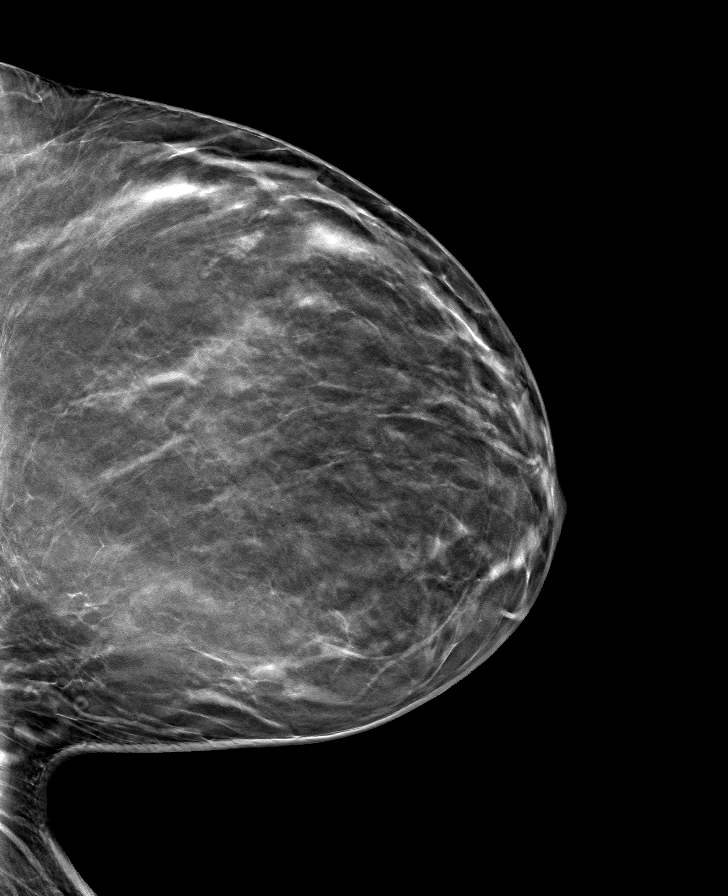

[R MLO tomo · tomo slice 41/80.0]
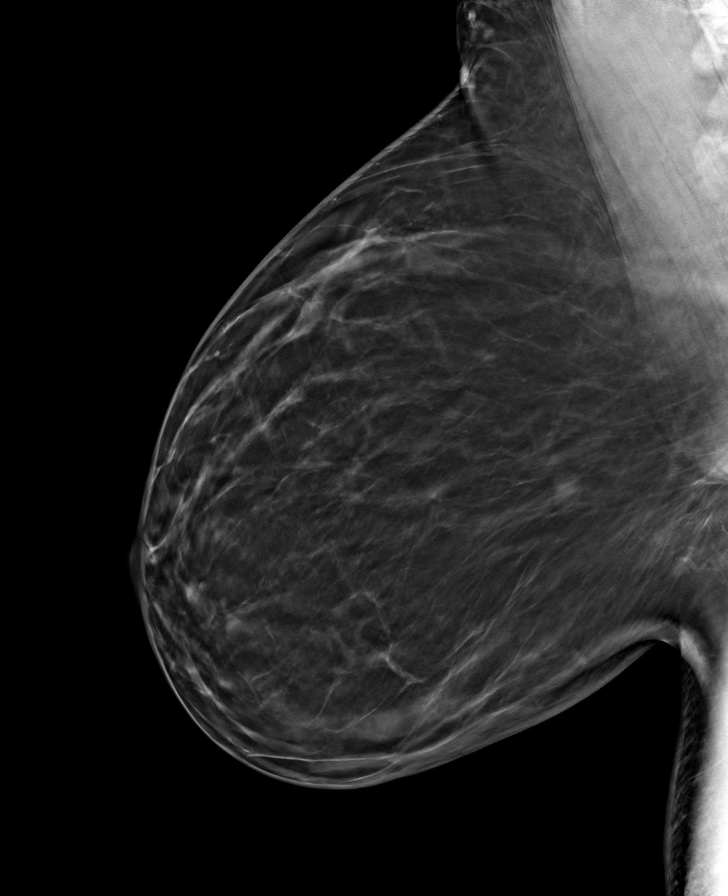

[L MLO tomo · tomo slice 40/79.0]
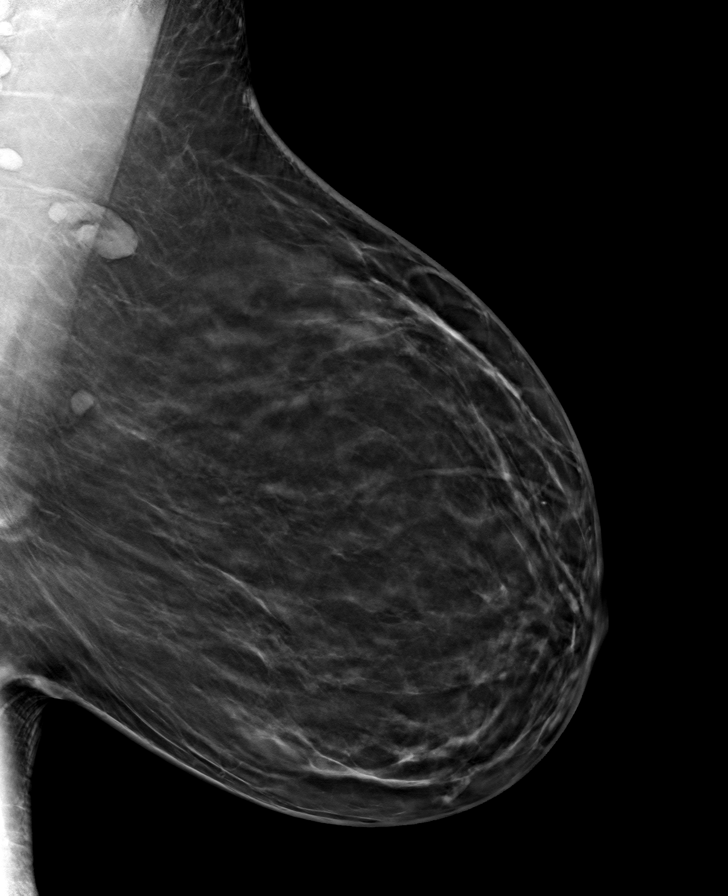

[8 of 24 positions shown; findings below may reference images not displayed]

ACR Breast Density Category b: There are scattered areas of
fibroglandular density.
FINDINGS: Stable mammographic appearance of the bilateral breasts. No new or
suspicious findings.

Targeted ultrasound is performed, showing slight interval
improvement of cortical thickness within a prominent left axillary
lymph node. This area now measures up to 4 mm (previously 5 mm).
IMPRESSION: 1. No mammographic evidence of malignancy in either breast.
2. Stable to slight interval decrease in size of left axillary lymph
node cortical thickness, likely normal/reactive. Recommendation is
for a final follow-up in 1 year.

RECOMMENDATION:
Bilateral diagnostic mammogram and left axillary ultrasound in 1
year.

I have discussed the findings and recommendations with the patient.
If applicable, a reminder letter will be sent to the patient
regarding the next appointment.

BI-RADS CATEGORY  3: Probably benign.

## 2022-10-22 IMAGING — US US AXILLARY LEFT
1 series · 10 of 10 positions shown · non-contrast
Comparison: Previous exam(s).

CLINICAL DATA: 39-year-old female presenting for bilateral
mammogram and ultrasound follow-up of the left axilla.

EXAM:
DIGITAL DIAGNOSTIC BILATERAL MAMMOGRAM WITH TOMOSYNTHESIS AND CAD;
US AXILLARY LEFT
TECHNIQUE: Bilateral digital diagnostic mammography and breast tomosynthesis
was performed. The images were evaluated with computer-aided
detection.; Targeted ultrasound examination of the left axilla was
performed.

[Series 1: us axillary left · 0.06mm/px · 10 of 10 slices shown]
[im 1/10]
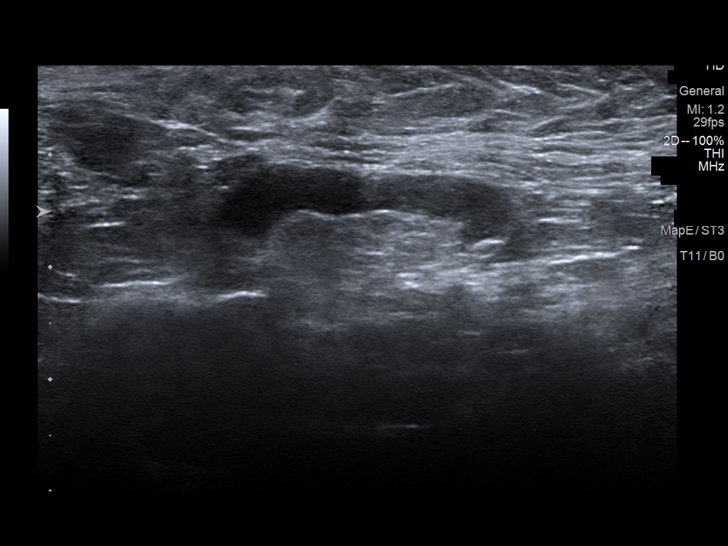
[im 2/10]
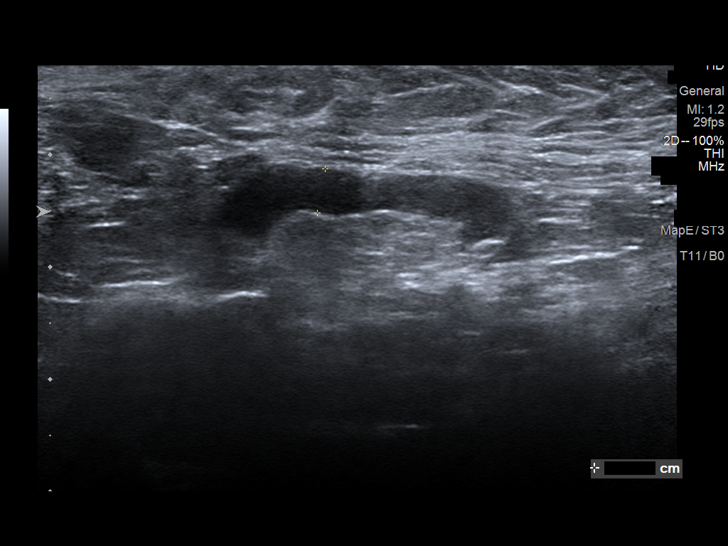
[im 3/10]
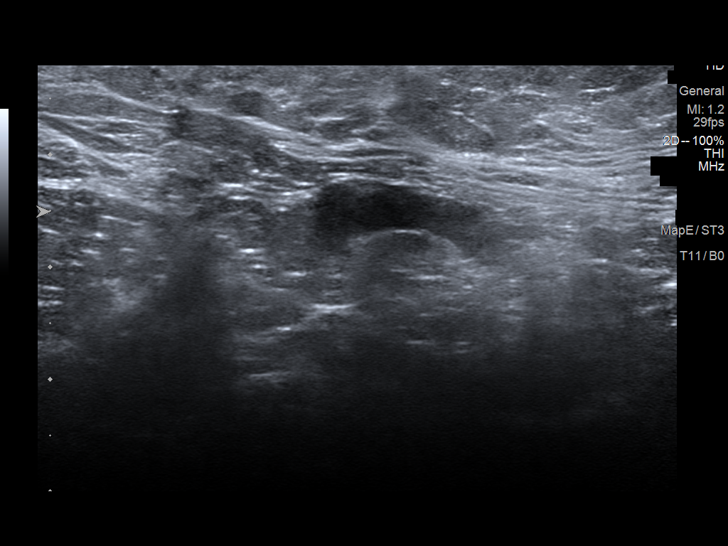
[im 4/10]
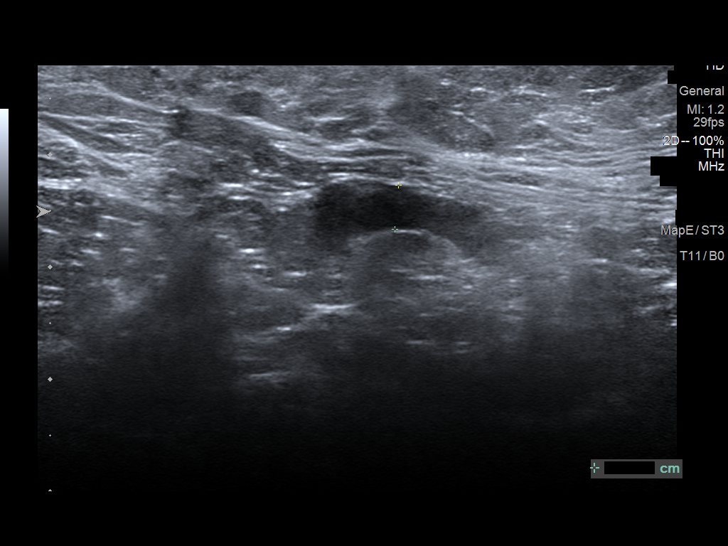
[im 5/10]
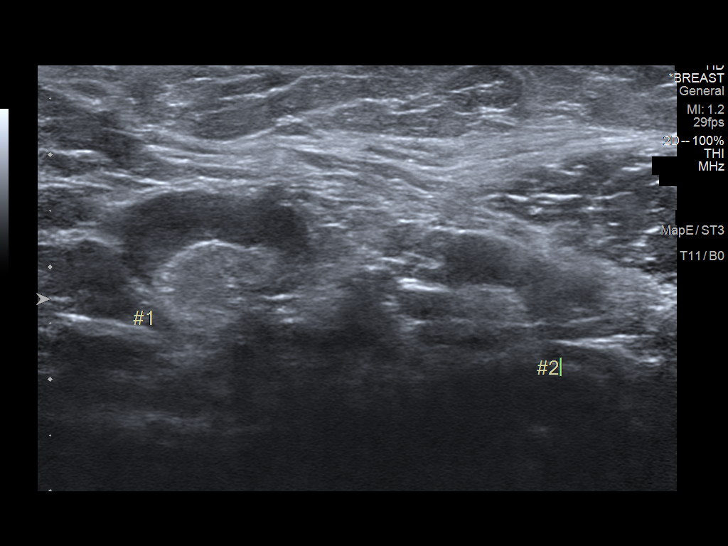
[im 6/10]
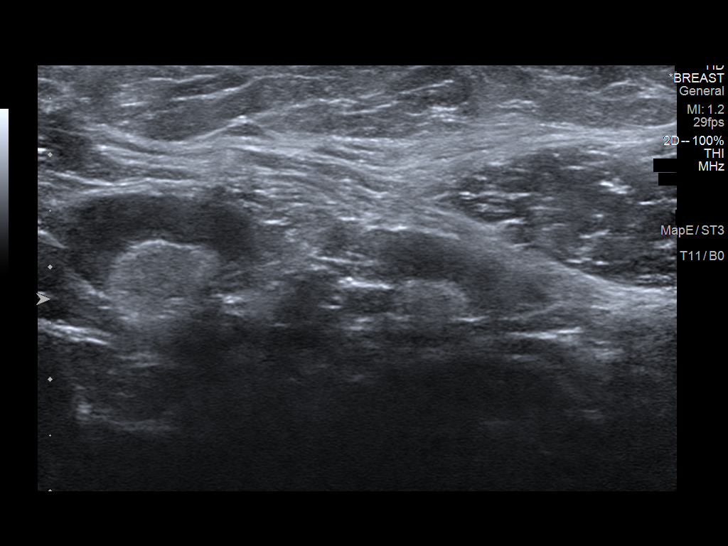
[im 7/10]
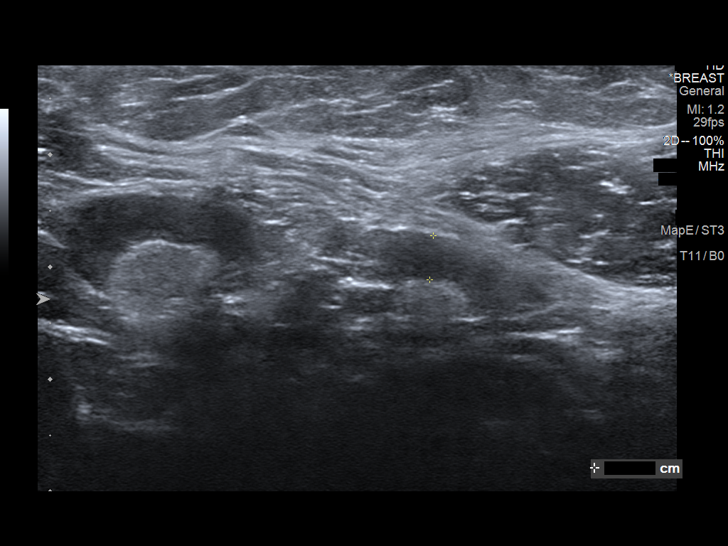
[im 8/10]
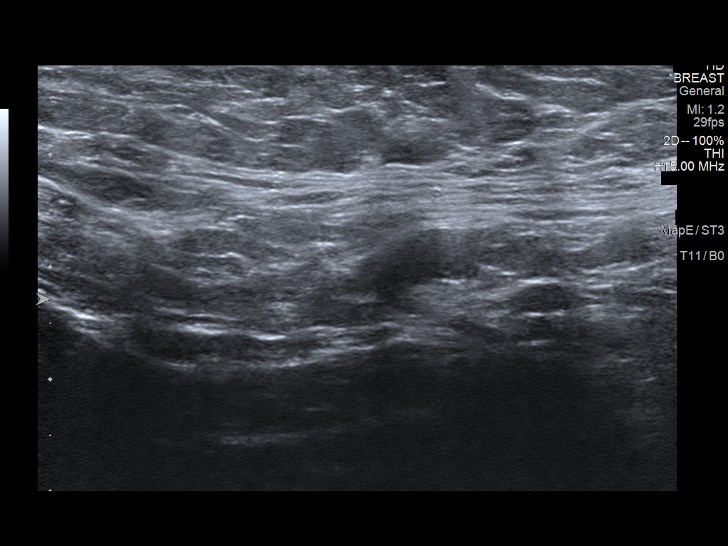
[im 9/10]
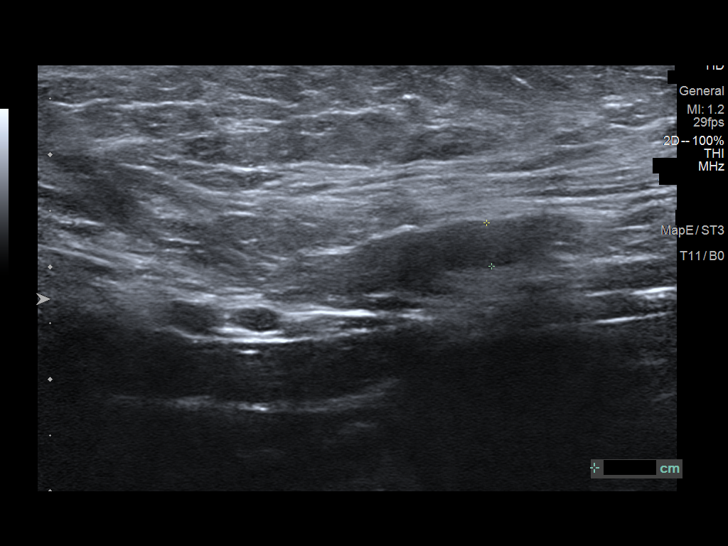
[im 10/10]
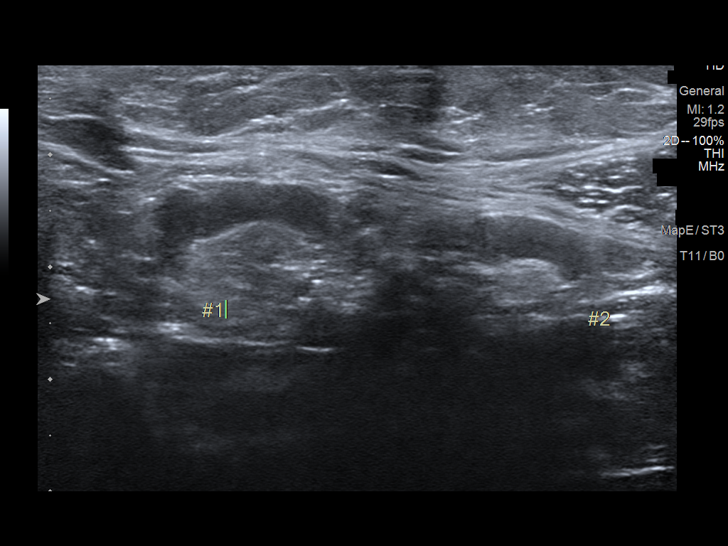

[10 of 10 positions shown; findings below may reference images not displayed]

ACR Breast Density Category b: There are scattered areas of
fibroglandular density.
FINDINGS: Stable mammographic appearance of the bilateral breasts. No new or
suspicious findings.

Targeted ultrasound is performed, showing slight interval
improvement of cortical thickness within a prominent left axillary
lymph node. This area now measures up to 4 mm (previously 5 mm).
IMPRESSION: 1. No mammographic evidence of malignancy in either breast.
2. Stable to slight interval decrease in size of left axillary lymph
node cortical thickness, likely normal/reactive. Recommendation is
for a final follow-up in 1 year.

RECOMMENDATION:
Bilateral diagnostic mammogram and left axillary ultrasound in 1
year.

I have discussed the findings and recommendations with the patient.
If applicable, a reminder letter will be sent to the patient
regarding the next appointment.

BI-RADS CATEGORY  3: Probably benign.

## 2023-01-31 DIAGNOSIS — Z0000 Encounter for general adult medical examination without abnormal findings: Secondary | ICD-10-CM | POA: Diagnosis not present

## 2023-01-31 DIAGNOSIS — E559 Vitamin D deficiency, unspecified: Secondary | ICD-10-CM | POA: Diagnosis not present

## 2023-01-31 DIAGNOSIS — E7849 Other hyperlipidemia: Secondary | ICD-10-CM | POA: Diagnosis not present

## 2023-05-28 DIAGNOSIS — Z1389 Encounter for screening for other disorder: Secondary | ICD-10-CM | POA: Diagnosis not present

## 2023-05-28 DIAGNOSIS — Z1331 Encounter for screening for depression: Secondary | ICD-10-CM | POA: Diagnosis not present

## 2023-05-28 DIAGNOSIS — Z716 Tobacco abuse counseling: Secondary | ICD-10-CM | POA: Diagnosis not present

## 2023-08-07 ENCOUNTER — Ambulatory Visit (INDEPENDENT_AMBULATORY_CARE_PROVIDER_SITE_OTHER): Admitting: Obstetrics

## 2023-08-07 ENCOUNTER — Other Ambulatory Visit (HOSPITAL_COMMUNITY)
Admission: RE | Admit: 2023-08-07 | Discharge: 2023-08-07 | Disposition: A | Discharge status: Home / Self Care | Source: Ambulatory Visit | Attending: Obstetrics | Admitting: Obstetrics

## 2023-08-07 ENCOUNTER — Encounter: Payer: Self-pay | Admitting: Obstetrics

## 2023-08-07 VITALS — BP 142/99 | HR 90 | Ht 65.0 in | Wt 218.7 lb

## 2023-08-07 DIAGNOSIS — F17200 Nicotine dependence, unspecified, uncomplicated: Secondary | ICD-10-CM

## 2023-08-07 DIAGNOSIS — Z01419 Encounter for gynecological examination (general) (routine) without abnormal findings: Secondary | ICD-10-CM | POA: Diagnosis not present

## 2023-08-07 DIAGNOSIS — J301 Allergic rhinitis due to pollen: Secondary | ICD-10-CM

## 2023-08-07 DIAGNOSIS — Z3041 Encounter for surveillance of contraceptive pills: Secondary | ICD-10-CM

## 2023-08-07 DIAGNOSIS — E669 Obesity, unspecified: Secondary | ICD-10-CM

## 2023-08-07 MED ORDER — LORATADINE 10 MG PO TABS: 10.0000 mg | ORAL_TABLET | Freq: Every day | ORAL | 11 refills | Status: AC

## 2023-08-07 NOTE — Progress Notes (Signed)
 Subjective:        Zoe Hernandez is a 43 y.o. female here for a routine exam.  Current complaints: Seasonal allergies.    Personal health questionnaire:  Is patient [name], have a family history of breast and/or ovarian cancer: yes Is there a family history of uterine cancer diagnosed at age < 9, gastrointestinal cancer, urinary tract cancer, family member who is a Personnel officer syndrome-associated carrier: no Is the patient overweight and hypertensive, family history of diabetes, personal history of gestational diabetes, preeclampsia or PCOS: yes Is patient over 27, have PCOS,  family history of premature CHD under age 58, diabetes, smoke, have hypertension or peripheral artery disease:  no At any time, has a partner hit, kicked or otherwise hurt or frightened you?: no Over the past 2 weeks, have you felt down, depressed or hopeless?: no Over the past 2 weeks, have you felt little interest or pleasure in doing things?:no   Gynecologic History Patient's last menstrual period was 07/18/2023 (exact date). Contraception: vasectomy Last Pap: 2024. Results were: normal Last mammogram: 2024. Results were: normal  Obstetric History OB History  Gravida Para Term Preterm AB Living  3 1 1  2 1   SAB IAB Ectopic Multiple Live Births   2   1    # Outcome Date GA Lbr Len/2nd Weight Sex Type Anes PTL Lv  3 Term 12/19/00     Vag-Spont   LIV  2 IAB           1 IAB             Past Medical History:  Diagnosis Date   Anemia    none recent   Carpal tunnel syndrome of left wrist    Dislocation of right knee with lateral meniscus tear    GERD (gastroesophageal reflux disease)     Past Surgical History:  Procedure Laterality Date   CARPAL TUNNEL RELEASE Right 08/11/2016   Procedure: RIGHT CARPAL TUNNEL RELEASE;  Surgeon: Brunilda Capra, MD;  Location: [facility];  Service: Orthopedics;  Laterality: Right;   CHOLECYSTECTOMY  not sure   laparoscopic   KNEE ARTHROSCOPY  WITH LATERAL MENISECTOMY Right 07/30/2020   Procedure: KNEE ARTHROSCOPY WITH PARTIAL LATERAL MENISECTOMY;  Surgeon: Janeth Medicus, MD;  Location: [facility];  Service: Orthopedics;  Laterality: Right;  62 MINS   TONSILLECTOMY  age 34   WISDOM TOOTH EXTRACTION  10/11/2012     Current Outpatient Medications:    albuterol (PROVENTIL HFA;VENTOLIN HFA) 108 (90 Base) MCG/ACT inhaler, Inhale into the lungs., Disp: , Rfl:    atorvastatin (LIPITOR) 20 MG tablet, Take 20 mg by mouth at bedtime., Disp: , Rfl:    calcium carbonate (TUMS - DOSED IN MG ELEMENTAL CALCIUM) 500 MG chewable tablet, Chew 1 tablet by mouth as needed for indigestion or heartburn., Disp: , Rfl:    loratadine  (CLARITIN ) 10 MG tablet, Take 1 tablet (10 mg total) by mouth daily., Disp: 30 tablet, Rfl: 11   Multiple Vitamin (MULTIVITAMIN ADULT PO), Take by mouth., Disp: , Rfl:    phentermine 37.5 MG capsule, Take by mouth., Disp: , Rfl:    Vitamin D , Ergocalciferol , (DRISDOL) 1.25 MG (50000 UNIT) CAPS capsule, Take 50,000 Units by mouth once a week. monday, Disp: , Rfl:    aspirin 325 MG tablet, Take 325 mg by mouth daily. (Patient not taking: Reported on 08/07/2023), Disp: , Rfl:    clotrimazole-betamethasone  (LOTRISONE) cream, Apply topically 2 (two) times daily as  needed. (Patient not taking: Reported on 08/07/2023), Disp: , Rfl:    ibuprofen  (ADVIL ) 800 MG tablet, TK 1 TABLET PO TID PRN (Patient not taking: Reported on 08/07/2023), Disp: 30 tablet, Rfl: 5   metroNIDAZOLE  (FLAGYL ) 500 MG tablet, Take 1 tablet (500 mg total) by mouth 2 (two) times daily. (Patient not taking: Reported on 08/07/2023), Disp: 14 tablet, Rfl: 2   norgestimate -ethinyl estradiol  (ORTHO-CYCLEN) 0.25-35 MG-MCG tablet, Take 1 tablet by mouth every evening. (Patient not taking: Reported on 08/07/2023), Disp: 28 tablet, Rfl: 11 No Known Allergies  Social History   Tobacco Use   Smoking status: Every Day    Types: Cigars   Smokeless tobacco:  Never   Tobacco comments:    1 -2 cigar/day  Substance Use Topics   Alcohol  use: Yes    Comment: socially    Family History  Problem Relation Age of Onset   Hypertension Mother    Heart disease Mother    Breast cancer Mother 40   Hypertension Father    Diabetes Father    Lung cancer Maternal Grandmother       Review of Systems  Constitutional: negative for fatigue and weight loss Respiratory: negative for cough and wheezing Cardiovascular: negative for chest pain, fatigue and palpitations Gastrointestinal: negative for abdominal pain and change in bowel habits Musculoskeletal:negative for myalgias Neurological: negative for gait problems and tremors Behavioral/Psych: negative for abusive relationship, depression Endocrine: negative for temperature intolerance    Genitourinary:negative for abnormal menstrual periods, genital lesions, hot flashes, sexual problems and vaginal discharge Integument/breast: negative for breast lump, breast tenderness, nipple discharge and skin lesion(s)    Objective:       BP (!) 142/99 (BP Location: Left Arm)   Pulse 90   Ht 5\' 5"  (1.651 m)   Wt 218 lb 11.2 oz (99.2 kg)   LMP 07/18/2023 (Exact Date)   BMI 36.39 kg/m  General:   alert  Skin:   no rash or abnormalities  Lungs:   clear to auscultation bilaterally  Heart:   regular rate and rhythm, S1, S2 normal, no murmur, click, rub or gallop  Breasts:   normal without suspicious masses, skin or nipple changes or axillary nodes  Abdomen:  normal findings: no organomegaly, soft, non-tender and no hernia  Pelvis:  External genitalia: normal general appearance Urinary system: urethral meatus normal and bladder without fullness, nontender Vaginal: normal without tenderness, induration or masses Cervix: normal appearance Adnexa: normal bimanual exam Uterus: anteverted and non-tender, normal size   Lab Review Urine pregnancy test Labs reviewed yes Radiologic studies reviewed yes  I have  spent a total of 20 minutes of face-to-face time, excluding clinical staff time, reviewing notes and preparing to see patient, ordering tests and/or medications, and counseling the patient.   Assessment:    1. Encounter for gynecological examination with Papanicolaou smear of cervix (Primary) Rx: - Cytology - PAP( [facility])  2. Encounter for surveillance of contraceptive pills - patient discontinued OCP's because husband got a vasectomy   3. Obesity (BMI 35.0-39.9 without comorbidity) - weight reduction with t he aid of dietary changes, exewrcise and behavioral modification recommended   4. Tobacco dependence - cessation recommended  5. Seasonal allergic rhinitis due to pollen Rx: - loratadine  (CLARITIN ) 10 MG tablet; Take 1 tablet (10 mg total) by mouth daily.  Dispense: 30 tablet; Refill: 11     Plan:    Education reviewed: calcium supplements, depression evaluation, low fat, low cholesterol diet, safe sex/STD prevention, self breast  exams, smoking cessation, and weight bearing exercise. Mammogram ordered. Follow up in: 1 year.   Meds ordered this encounter  Medications   loratadine  (CLARITIN ) 10 MG tablet    Sig: Take 1 tablet (10 mg total) by mouth daily.    Dispense:  30 tablet    Refill:  11    Gabrielle Joiner, MD, FACOG Attending Obstetrician & Gynecologist, [facility], [facility], [place] 08/07/2023

## 2023-08-07 NOTE — Progress Notes (Signed)
No concerns at this time. `

## 2023-08-09 LAB — CYTOLOGY - PAP
Comment: NEGATIVE
Diagnosis: NEGATIVE
High risk HPV: NEGATIVE

## 2023-10-02 ENCOUNTER — Other Ambulatory Visit: Payer: Self-pay | Admitting: Internal Medicine

## 2023-10-02 DIAGNOSIS — Z1231 Encounter for screening mammogram for malignant neoplasm of breast: Secondary | ICD-10-CM

## 2023-10-17 ENCOUNTER — Ambulatory Visit
Admission: RE | Admit: 2023-10-17 | Discharge: 2023-10-17 | Disposition: A | Discharge status: Home / Self Care | Source: Ambulatory Visit | Attending: Internal Medicine | Admitting: Internal Medicine

## 2023-10-17 DIAGNOSIS — Z1231 Encounter for screening mammogram for malignant neoplasm of breast: Secondary | ICD-10-CM
